# Patient Record
Sex: Male | Born: 1965 | Race: Black or African American | Hispanic: No
Health system: Southern US, Community
[De-identification: ages and names within clinical notes are randomized; demographics above are authoritative.]

## PROBLEM LIST (undated history)

## (undated) DIAGNOSIS — T7840XA Allergy, unspecified, initial encounter: Secondary | ICD-10-CM

## (undated) DIAGNOSIS — Z87442 Personal history of urinary calculi: Secondary | ICD-10-CM

## (undated) DIAGNOSIS — I10 Essential (primary) hypertension: Secondary | ICD-10-CM

## (undated) HISTORY — DX: Allergy, unspecified, initial encounter: T78.40XA

## (undated) HISTORY — PX: NO PAST SURGERIES: SHX2092

---

## 2004-05-26 ENCOUNTER — Emergency Department: Payer: Self-pay | Admitting: Unknown Physician Specialty

## 2015-11-17 ENCOUNTER — Ambulatory Visit: Payer: Self-pay | Admitting: Family Medicine

## 2015-12-09 ENCOUNTER — Ambulatory Visit (INDEPENDENT_AMBULATORY_CARE_PROVIDER_SITE_OTHER): Payer: Managed Care, Other (non HMO) | Admitting: Family Medicine

## 2015-12-09 ENCOUNTER — Encounter: Payer: Self-pay | Admitting: Family Medicine

## 2015-12-09 VITALS — BP 158/90 | HR 79 | Temp 97.7°F | Resp 16 | Ht 68.0 in | Wt 187.0 lb

## 2015-12-09 DIAGNOSIS — R208 Other disturbances of skin sensation: Secondary | ICD-10-CM

## 2015-12-09 DIAGNOSIS — Z7189 Other specified counseling: Secondary | ICD-10-CM | POA: Diagnosis not present

## 2015-12-09 DIAGNOSIS — Z7689 Persons encountering health services in other specified circumstances: Secondary | ICD-10-CM

## 2015-12-09 DIAGNOSIS — Z125 Encounter for screening for malignant neoplasm of prostate: Secondary | ICD-10-CM | POA: Diagnosis not present

## 2015-12-09 DIAGNOSIS — R030 Elevated blood-pressure reading, without diagnosis of hypertension: Secondary | ICD-10-CM

## 2015-12-09 DIAGNOSIS — R200 Anesthesia of skin: Secondary | ICD-10-CM | POA: Insufficient documentation

## 2015-12-09 DIAGNOSIS — I10 Essential (primary) hypertension: Secondary | ICD-10-CM | POA: Insufficient documentation

## 2015-12-09 DIAGNOSIS — IMO0001 Reserved for inherently not codable concepts without codable children: Secondary | ICD-10-CM

## 2015-12-09 NOTE — Patient Instructions (Signed)
Your goal blood pressure is 140/90- please check 2-3 times per week and write it down. Work on low salt/sodium diet - goal <1.5gm (1,500mg ) per day. Eat a diet high in fruits/vegetables and whole grains.  Look into mediterranean and DASH diet. Goal activity is 17750min/wk of moderate intensity exercise.  This can be split into 30 minute chunks.  If you are not at this level, you can start with smaller 10-15 min increments and slowly build up activity. Look at www.heart.org for more resources  Please seek immediate medical attention at ER or Urgent Care if you develop: Chest pain, pressure or tightness. Shortness of breath accompanied by nausea or diaphoresis Visual changes Numbness or tingling on one side of the body Facial droop Altered mental status Or any concerning symptoms.

## 2015-12-09 NOTE — Assessment & Plan Note (Addendum)
Elevated BP- will check at home 2-3 times weekly. Recheck in 1 mos. Reviewed DASH diet. Reviewed alarm symptoms. Recheck 1 mos.

## 2015-12-09 NOTE — Assessment & Plan Note (Addendum)
ECG-machine read showed possible old infarct but no acute changes. Do not believe arm numbness is cardiac related. ECG looks WNL. Likely nerve impingement possible due to shoulder. Will continue to monitor.

## 2015-12-09 NOTE — Progress Notes (Signed)
Subjective:    Patient ID: Ronald Sweeney, male    DOB: Jun 27, 1966, 50 y.o.   MRN: [id number]  HPI: Ronald Sweeney is a 50 y.o. male presenting on 12/09/2015 for Establish Care and Hypertension   HPI  Pt presents to establish care today. Previous care provider was .  It has been several years years since His last PCP visit. Records from previous provider will be requested and reviewed. Current medical problems include:  Pt presents for hypertension- found at dentist. Does get anxious at doctor and dentist. No SOB. No CP. No HA. Does get occasional floaters when changing position. Has been trying to cut down salt and fried food.   Does report a few episodes of L arm numbness. 2-3 times in past year. No chest pressure of pain during arm numbness. From bicep to mid-forearm. Primarily upper arm not in the shoulder.  Has taken and advil and it will go away. Happened last 3 mos ago. Lasts about 1-2 hours. No neck injury. Lifts- has some repetitve motions. Does notice some crepitus in the L shoulder with up down movements.   Health maintenance:  Does some lifting at the gym. Trying to not do lifting with shoulders.  Would like to screen PSA. Does have family history of colon cancer.  Never had a colonoscopy.     Past Medical History  Diagnosis Date  . Allergy    Social History   Social History  . Marital Status: Married    Spouse Name: N/A  . Number of Children: N/A  . Years of Education: N/A   Occupational History  . Not on file.   Social History Main Topics  . Smoking status: Never Smoker   . Smokeless tobacco: Never Used  . Alcohol Use: No  . Drug Use: No  . Sexual Activity: Yes   Other Topics Concern  . Not on file   Social History Narrative  . No narrative on file   Family History  Problem Relation Age of Onset  . Cancer Mother     colon cancer  . Colon polyps Mother    No current outpatient prescriptions on file prior to visit.   No current  facility-administered medications on file prior to visit.    Review of Systems  Constitutional: Negative for fever and chills.  HENT: Negative.   Respiratory: Negative for chest tightness, shortness of breath and wheezing.   Cardiovascular: Negative for chest pain, palpitations and leg swelling.  Gastrointestinal: Negative for nausea, vomiting and abdominal pain.  Endocrine: Negative.   Genitourinary: Negative for dysuria, urgency, discharge, penile pain and testicular pain.  Musculoskeletal: Negative for back pain, joint swelling and arthralgias.  Skin: Negative.   Neurological: Positive for numbness (L arm from bicep to forearm- intermittently.  ). Negative for dizziness, weakness and headaches.  Psychiatric/Behavioral: Negative for sleep disturbance and dysphoric mood.   Per HPI unless specifically indicated above     Objective:    BP 158/90 mmHg  Pulse 79  Temp(Src) 97.7 F (36.5 C) (Oral)  Resp 16  Ht 5\' 8"  (1.727 m)  Wt 187 lb (84.823 kg)  BMI 28.44 kg/m2  Wt Readings from Last 3 Encounters:  12/09/15 187 lb (84.823 kg)    Physical Exam  Constitutional: He is oriented to person, place, and time. He appears well-developed and well-nourished. No distress.  HENT:  Head: Normocephalic and atraumatic.  Neck: Neck supple. No thyromegaly present.  Cardiovascular: Normal rate, regular rhythm and normal heart sounds.  Exam reveals no gallop and no friction rub.   No murmur heard. Pulmonary/Chest: Effort normal and breath sounds normal. He has no wheezes.  Abdominal: Soft. Bowel sounds are normal. He exhibits no distension. There is no tenderness. There is no rebound.  Musculoskeletal: Normal range of motion. He exhibits no edema or tenderness.       Left shoulder: He exhibits crepitus (some with movement. ). He exhibits normal range of motion, no tenderness, no pain and normal strength.  Neurological: He is alert and oriented to person, place, and time. He has normal reflexes.   Skin: Skin is warm and dry. No rash noted. No erythema.  Psychiatric: He has a normal mood and affect. His behavior is normal. Thought content normal.   No results found for this or any previous visit.    Assessment & Plan:   Problem List Items Addressed This Visit      Other   Left arm numbness    ECG-machine read showed possible old infarct but no acute changes. Do not believe arm numbness is cardiac related. ECG looks WNL. Likely nerve impingement possible due to shoulder. Will continue to monitor.       Relevant Orders   EKG 12-Lead   Elevated BP    Elevated BP- will check at home 2-3 times weekly. Recheck in 1 mos. Reviewed DASH diet. Reviewed alarm symptoms. Recheck 1 mos.       Relevant Orders   COMPLETE METABOLIC PANEL WITH GFR   Lipid Profile    Other Visit Diagnoses    Encounter to establish care    -  Primary    Screening for prostate cancer        Relevant Orders    PSA       No orders of the defined types were placed in this encounter.      Follow up plan: Return in about 4 weeks (around 01/06/2016), or if symptoms worsen or fail to improve, for BP check. Marland Kitchen

## 2016-01-10 ENCOUNTER — Other Ambulatory Visit: Payer: Self-pay | Admitting: Family Medicine

## 2016-01-10 ENCOUNTER — Other Ambulatory Visit: Payer: Managed Care, Other (non HMO)

## 2016-01-11 LAB — COMPLETE METABOLIC PANEL WITH GFR
ALBUMIN: 4.2 g/dL (ref 3.6–5.1)
ALK PHOS: 46 U/L (ref 40–115)
ALT: 19 U/L (ref 9–46)
AST: 17 U/L (ref 10–40)
BILIRUBIN TOTAL: 0.5 mg/dL (ref 0.2–1.2)
BUN: 13 mg/dL (ref 7–25)
CALCIUM: 9.4 mg/dL (ref 8.6–10.3)
CO2: 26 mmol/L (ref 20–31)
Chloride: 103 mmol/L (ref 98–110)
Creat: 1.13 mg/dL (ref 0.60–1.35)
GFR, EST AFRICAN AMERICAN: 88 mL/min (ref >=60)
GFR, EST NON AFRICAN AMERICAN: 76 mL/min (ref >=60)
Glucose, Bld: 104 mg/dL — ABNORMAL HIGH (ref 65–99)
Potassium: 4.1 mmol/L (ref 3.5–5.3)
Sodium: 141 mmol/L (ref 135–146)
TOTAL PROTEIN: 6.9 g/dL (ref 6.1–8.1)

## 2016-01-11 LAB — LIPID PANEL
CHOLESTEROL: 195 mg/dL (ref 125–200)
HDL: 68 mg/dL (ref >=40)
LDL Cholesterol: 99 mg/dL (ref <130)
TRIGLYCERIDES: 139 mg/dL (ref <150)
Total CHOL/HDL Ratio: 2.9 Ratio (ref <=5.0)
VLDL: 28 mg/dL (ref <30)

## 2016-01-11 LAB — PSA: PSA: 1.49 ng/mL (ref <=4.00)

## 2016-01-12 ENCOUNTER — Encounter: Payer: Self-pay | Admitting: Family Medicine

## 2016-01-12 ENCOUNTER — Ambulatory Visit (INDEPENDENT_AMBULATORY_CARE_PROVIDER_SITE_OTHER): Payer: Managed Care, Other (non HMO) | Admitting: Family Medicine

## 2016-01-12 VITALS — BP 138/92 | HR 95 | Temp 97.7°F | Resp 16 | Ht 68.0 in | Wt 188.0 lb

## 2016-01-12 DIAGNOSIS — R030 Elevated blood-pressure reading, without diagnosis of hypertension: Secondary | ICD-10-CM | POA: Diagnosis not present

## 2016-01-12 DIAGNOSIS — M25512 Pain in left shoulder: Secondary | ICD-10-CM | POA: Diagnosis not present

## 2016-01-12 DIAGNOSIS — R7301 Impaired fasting glucose: Secondary | ICD-10-CM | POA: Diagnosis not present

## 2016-01-12 DIAGNOSIS — IMO0001 Reserved for inherently not codable concepts without codable children: Secondary | ICD-10-CM

## 2016-01-12 LAB — POCT GLYCOSYLATED HEMOGLOBIN (HGB A1C): Hemoglobin A1C: 5.9

## 2016-01-12 NOTE — Progress Notes (Signed)
Subjective:    Patient ID: Ronald Sweeney, male    DOB: 1966-06-30, 50 y.o.   MRN: [id number]  HPI: Ronald Sweeney is a 50 y.o. male presenting on 01/12/2016 for Hypertension   HPI  Pt presents for follow-up of elevated BP. Cholesterol is elevated. ASCVD risk is 6.5%. He would like to avoid cholesterol medication at this time.  BP is elevated but he worked out about 1 hour ago. Was not checking at home.  Would like to lose some weight- no consistent exercise schedule. Did workout this morning just prior to appt.  Glucose is was elevated- fasting. Drinks soda, sweet tea. Has tried to cut out soda. Drinks ginger ale or juice a few times per week.  L shoulder pain when lifting. Hears clicking and popping. Pain is not constant. Only occurs with certain movements.   Past Medical History  Diagnosis Date  . Allergy     No current outpatient prescriptions on file prior to visit.   No current facility-administered medications on file prior to visit.    Review of Systems  Constitutional: Negative for fever and chills.  HENT: Negative.   Respiratory: Negative for chest tightness, shortness of breath and wheezing.   Cardiovascular: Negative for chest pain, palpitations and leg swelling.  Gastrointestinal: Negative for nausea, vomiting and abdominal pain.  Endocrine: Negative.   Genitourinary: Negative for dysuria, urgency, discharge, penile pain and testicular pain.  Musculoskeletal: Positive for arthralgias. Negative for back pain and joint swelling.  Skin: Negative.   Neurological: Negative for dizziness, weakness, numbness and headaches.  Psychiatric/Behavioral: Negative for sleep disturbance and dysphoric mood.   Per HPI unless specifically indicated above     Objective:    BP 138/92 mmHg  Pulse 95  Temp(Src) 97.7 F (36.5 C)  Resp 16  Ht  (1.727 m)  Wt 188 lb (85.276 kg)  BMI 28.59 kg/m2  SpO2 98%  Wt Readings from Last 3 Encounters:  01/12/16 188 lb (85.276  kg)  12/09/15 187 lb (84.823 kg)    Physical Exam  Constitutional: He is oriented to person, place, and time. He appears well-developed and well-nourished. No distress.  HENT:  Head: Normocephalic and atraumatic.  Neck: Neck supple. No thyromegaly present.  Cardiovascular: Normal rate, regular rhythm and normal heart sounds.  Exam reveals no gallop and no friction rub.   No murmur heard. Pulmonary/Chest: Effort normal and breath sounds normal. He has no wheezes.  Abdominal: Soft. Bowel sounds are normal. He exhibits no distension. There is no tenderness. There is no rebound.  Musculoskeletal: Normal range of motion. He exhibits no edema or tenderness.       Right shoulder: Normal.       Left shoulder: He exhibits normal range of motion, no tenderness, no bony tenderness, no swelling, no crepitus and no pain.  +painful arc test on L shoulder.   Neurological: He is alert and oriented to person, place, and time. He has normal reflexes.  Skin: Skin is warm and dry. No rash noted. No erythema.  Psychiatric: He has a normal mood and affect. His behavior is normal. Thought content normal.   Results for orders placed or performed in visit on 01/12/16  POCT HgB A1C  Result Value Ref Range   Hemoglobin A1C 5.9       Assessment & Plan:   Problem List Items Addressed This Visit      Endocrine   Elevated fasting glucose - Primary    A1c is 5.9%  is prediabetic. Reviewed goals for weight loss and exercise. Pt plans to eliminate soda and juice. Plan to recheck in 3 mos.       Relevant Orders   POCT HgB A1C (Completed)     Other   Elevated BP    BP significantly reduced on recheck. However with vigorous exercise within 1 hour of appt- cannot officially diagnose hypertension. Will plan on having patient return for BP recheck at RN visit. Reviewed DASH diet.        Other Visit Diagnoses    Left shoulder pain        Arthritis vs rotator cuff injury. XR of shoulder. PRN Aleve. Plan on ortho  referral for continued pain.     Relevant Orders    DG Shoulder Left       No orders of the defined types were placed in this encounter.      Follow up plan: Return in about 3 months (around 04/13/2016) for Prediabetes. .Marland Kitchen

## 2016-01-12 NOTE — Assessment & Plan Note (Signed)
A1c is 5.9% is prediabetic. Reviewed goals for weight loss and exercise. Pt plans to eliminate soda and juice. Plan to recheck in 3 mos.

## 2016-01-12 NOTE — Assessment & Plan Note (Signed)
BP significantly reduced on recheck. However with vigorous exercise within 1 hour of appt- cannot officially diagnose hypertension. Will plan on having patient return for BP recheck at RN visit. Reviewed DASH diet.

## 2016-01-12 NOTE — Patient Instructions (Addendum)
Our goal is to prevent diabetes with treatment of this through diet and lifestyle changes.  Eliminate all sugar sweetened beverages from diet. Decrease portion sizes of breads, rice, pasta. Increase exercise to 30-40 minutes 3-4 times per week. The CDC and American Diabetes Association have great resources on their websites.  We will recheck your A1c in 3 mos.   Your goal blood pressure is 140/90. Work on low salt/sodium diet - goal <1.5gm (1,500mg ) per day. Eat a diet high in fruits/vegetables and whole grains.  Look into mediterranean and DASH diet. Goal activity is 15150min/wk of moderate intensity exercise.  This can be split into 30 minute chunks.  If you are not at this level, you can start with smaller 10-15 min increments and slowly build up activity. Look at www.heart.org for more resources  Please come back in a few weeks to have your blood pressure checked. Please come get your shoulder XR when you come back for blood pressure.

## 2016-02-21 ENCOUNTER — Ambulatory Visit (INDEPENDENT_AMBULATORY_CARE_PROVIDER_SITE_OTHER): Payer: Managed Care, Other (non HMO) | Admitting: Family Medicine

## 2016-02-21 VITALS — BP 144/98 | HR 81 | Temp 98.4°F | Resp 16 | Ht 68.0 in | Wt 193.0 lb

## 2016-02-21 DIAGNOSIS — IMO0001 Reserved for inherently not codable concepts without codable children: Secondary | ICD-10-CM

## 2016-02-21 DIAGNOSIS — L237 Allergic contact dermatitis due to plants, except food: Secondary | ICD-10-CM | POA: Diagnosis not present

## 2016-02-21 DIAGNOSIS — R030 Elevated blood-pressure reading, without diagnosis of hypertension: Secondary | ICD-10-CM | POA: Diagnosis not present

## 2016-02-21 MED ORDER — TRIAMCINOLONE ACETONIDE 0.1 % EX CREA: 1.0000 "application " | TOPICAL_CREAM | Freq: Two times a day (BID) | CUTANEOUS | 0 refills | Status: DC

## 2016-02-21 MED ORDER — PREDNISONE 10 MG PO TABS: ORAL_TABLET | ORAL | 0 refills | Status: DC

## 2016-02-21 NOTE — Progress Notes (Signed)
Subjective:    Patient ID: Ronald Sweeney, male    DOB: [birth year], 50 y.o.   MRN: [id number]  HPI: Ronald OctaveDexter T Bostwick is a 50 y.o. male [facility]   HPI  Pt presents for poision ivy or poison oak. Mowed on Friday and had lesions by Sunday morning. Lesions on L arm. Has been doing benadryl cream at home.   Past Medical History:  Diagnosis Date  . Allergy     No current outpatient prescriptions on file prior to visit.   No current facility-administered medications on file prior to visit.     Review of Systems  Constitutional: Negative for fever.  Respiratory: Negative for apnea, shortness of breath and wheezing.   Cardiovascular: Negative for chest pain, palpitations and leg swelling.  Skin: Positive for rash.   Per HPI unless specifically indicated above     Objective:    BP (!) 144/98 (BP Location: Left Arm, Cuff Size: Large)   Pulse 81   Temp 98.4 F (36.9 C) (Oral)   Resp 16   Ht 5\' 8"  (1.727 m)   Wt 193 lb (87.5 kg)   BMI 29.35 kg/m   Wt Readings from Last 3 Encounters:  02/21/16 193 lb (87.5 kg)  01/12/16 188 lb (85.3 kg)  12/09/15 187 lb (84.8 kg)    Physical Exam  Constitutional: He appears well-developed and well-nourished. No distress.  Neck: Normal range of motion. Neck supple.  Cardiovascular: Normal rate and regular rhythm.  Exam reveals no gallop and no friction rub.   No murmur heard. Pulmonary/Chest: Effort normal and breath sounds normal. He has no wheezes. He exhibits no tenderness.  Skin: Skin is warm, dry and intact. Rash noted. Rash is macular, papular and vesicular. He is not diaphoretic.  Scattered macules, papules with some vesicular lesions on the R forearm.    Results for orders placed or performed in visit on 01/12/16  POCT HgB A1C  Result Value Ref Range   Hemoglobin A1C 5.9       Assessment & Plan:   Problem List Items Addressed This Visit      Other   Elevated BP    Discussed  starting medication with patient today. Pt would like to wait 1 mos to apply lifestyle changes. Pt will check BP at home. Alarm symptoms reviewed. Encouraged DASH diet. Recheck 4 weeks.        Other Visit Diagnoses    Poison oak dermatitis    -  Primary   Treat symptomatically with triamcinolone and steroid taper. Alarm symptoms reviewed. Return if not improving.    Relevant Medications   triamcinolone cream (KENALOG) 0.1 %   predniSONE (DELTASONE) 10 MG tablet      Meds ordered this encounter  Medications  . triamcinolone cream (KENALOG) 0.1 %    Sig: Apply 1 application topically 2 (two) times daily.    Dispense:  30 g    Refill:  0    Order Specific Question:   Supervising Provider    Answer:   Janeann ForehandHAWKINS JR, JAMES H [[id number]]  . predniSONE (DELTASONE) 10 MG tablet    Sig: Take 6 pills today, 5 pills day 2, 4 pills day 3, 3 pills day 4, 2 pills day 5, 1 pill day 6. STOP.    Dispense:  21 tablet    Refill:  0    Order Specific Question:   Supervising Provider    Answer:   Juanetta GoslingHAWKINS  JR, JAMTeresita MaduraS H [[id number]]      Follow up plan: Return if symptoms worsen or fail to improve.

## 2016-02-21 NOTE — Patient Instructions (Addendum)
I think we should start blood pressure medication today. However we can try 1 more month of lifestyle modifications.  Your goal blood pressure is 140/90 Work on low salt/sodium diet - goal <1.5gm (1,500mg ) per day. Eat a diet high in fruits/vegetables and whole grains.  Look into mediterranean and DASH diet. Goal activity is 14750min/wk of moderate intensity exercise.  This can be split into 30 minute chunks.  If you are not at this level, you can start with smaller 10-15 min increments and slowly build up activity. Look at www.heart.org for more resources   [facility] Poison ivy is a inflammation of the skin (contact dermatitis) caused by touching the allergens on the leaves of the ivy plant following previous exposure to the plant. The rash usually appears 48 hours after exposure. The rash is usually bumps (papules) or blisters (vesicles) in a linear pattern. Depending on your own sensitivity, the rash may simply cause redness and itching, or it may also progress to blisters which may break open. These must be well cared for to prevent secondary bacterial (germ) infection, followed by scarring. Keep any open areas dry, clean, dressed, and covered with an antibacterial ointment if needed. The eyes may also get puffy. The puffiness is worst in the morning and gets better as the day progresses. This dermatitis usually heals without scarring, within 2 to 3 weeks without treatment. HOME CARE INSTRUCTIONS  Thoroughly wash with soap and water as soon as you have been exposed to poison ivy. You have about one half hour to remove the plant resin before it will cause the rash. This washing will destroy the oil or antigen on the skin that is causing, or will cause, the rash. Be sure to wash under your fingernails as any plant resin there will continue to spread the rash. Do not rub skin vigorously when washing affected area. Poison ivy cannot spread if no oil from the plant remains on your body. A rash that has  progressed to weeping sores will not spread the rash unless you have not washed thoroughly. It is also important to wash any clothes you have been wearing as these may carry active allergens. The rash will return if you wear the unwashed clothing, even several days later. Avoidance of the plant in the future is the best measure. Poison ivy plant can be recognized by the number of leaves. Generally, poison ivy has three leaves with flowering branches on a single stem. Diphenhydramine may be purchased over the counter and used as needed for itching. Do not drive with this medication if it makes you drowsy.Ask your caregiver about medication for children. SEEK MEDICAL CARE IF:  Open sores develop.  Redness spreads beyond area of rash.  You notice purulent (pus-like) discharge.  You have increased pain.  Other signs of infection develop (such as fever).   This information is not intended to replace advice given to you by your health care provider. Make sure you discuss any questions you have with your health care provider.   Document Released: 06/15/2000 Document Revised: 09/10/2011 Document Reviewed: 11/24/2014 Elsevier Interactive Patient Education [employer]2016 [employer].

## 2016-02-21 NOTE — Assessment & Plan Note (Signed)
Discussed starting medication with patient today. Pt would like to wait 1 mos to apply lifestyle changes. Pt will check BP at home. Alarm symptoms reviewed. Encouraged DASH diet. Recheck 4 weeks.

## 2016-10-12 ENCOUNTER — Encounter: Payer: Self-pay | Admitting: Nurse Practitioner

## 2016-10-12 ENCOUNTER — Ambulatory Visit (INDEPENDENT_AMBULATORY_CARE_PROVIDER_SITE_OTHER): Payer: Managed Care, Other (non HMO) | Admitting: Nurse Practitioner

## 2016-10-12 VITALS — BP 140/90 | HR 82 | Temp 98.2°F | Resp 16 | Ht 68.0 in | Wt 191.0 lb

## 2016-10-12 DIAGNOSIS — Z6829 Body mass index (BMI) 29.0-29.9, adult: Secondary | ICD-10-CM | POA: Diagnosis not present

## 2016-10-12 DIAGNOSIS — Z0000 Encounter for general adult medical examination without abnormal findings: Secondary | ICD-10-CM | POA: Diagnosis not present

## 2016-10-12 DIAGNOSIS — I10 Essential (primary) hypertension: Secondary | ICD-10-CM

## 2016-10-12 LAB — COMPLETE METABOLIC PANEL WITH GFR
ALT: 20 U/L (ref 9–46)
AST: 20 U/L (ref 10–40)
Albumin: 4.4 g/dL (ref 3.6–5.1)
Alkaline Phosphatase: 42 U/L (ref 40–115)
BUN: 15 mg/dL (ref 7–25)
CO2: 23 mmol/L (ref 20–31)
Calcium: 9.6 mg/dL (ref 8.6–10.3)
Chloride: 103 mmol/L (ref 98–110)
Creat: 1.21 mg/dL (ref 0.60–1.35)
GFR, Est African American: 81 mL/min (ref >=60)
GFR, Est Non African American: 70 mL/min (ref >=60)
Glucose, Bld: 103 mg/dL — ABNORMAL HIGH (ref 65–99)
Potassium: 4 mmol/L (ref 3.5–5.3)
Sodium: 140 mmol/L (ref 135–146)
Total Bilirubin: 0.7 mg/dL (ref 0.2–1.2)
Total Protein: 7.4 g/dL (ref 6.1–8.1)

## 2016-10-12 LAB — LIPID PANEL
Cholesterol: 185 mg/dL (ref <200)
HDL: 71 mg/dL (ref >40)
LDL Cholesterol: 95 mg/dL (ref <100)
Total CHOL/HDL Ratio: 2.6 Ratio (ref <5.0)
Triglycerides: 97 mg/dL (ref <150)
VLDL: 19 mg/dL (ref <30)

## 2016-10-12 LAB — CBC
HCT: 45.4 % (ref 38.5–50.0)
Hemoglobin: 15.1 g/dL (ref 13.2–17.1)
MCH: 27.7 pg (ref 27.0–33.0)
MCHC: 33.3 g/dL (ref 32.0–36.0)
MCV: 83.2 fL (ref 80.0–100.0)
MPV: 10 fL (ref 7.5–12.5)
Platelets: 166 10*3/uL (ref 140–400)
RBC: 5.46 MIL/uL (ref 4.20–5.80)
RDW: 13.4 % (ref 11.0–15.0)
WBC: 4.6 10*3/uL (ref 3.8–10.8)

## 2016-10-12 MED ORDER — HYDROCHLOROTHIAZIDE 12.5 MG PO TABS: 12.5000 mg | ORAL_TABLET | Freq: Every day | ORAL | 2 refills | Status: DC

## 2016-10-12 NOTE — Addendum Note (Signed)
Addended by: Wilhelmina Mcardle R on: 10/12/2016 01:57 PM   Modules accepted: Level of Service

## 2016-10-12 NOTE — Progress Notes (Signed)
I have reviewed this encounter including the documentation in this note and/or discussed this patient with the provider, Wilhelmina Mcardle, AGPCNP-BC. I am certifying that I agree with the content of this note as supervising physician.  Saralyn Pilar, DO [facility] 10/12/2016, 6:14 PM

## 2016-10-12 NOTE — Progress Notes (Signed)
Subjective:    Patient ID: Ronald Sweeney, male    DOB: Apr 07, 1966, 51 y.o.   MRN: [id number]  Ronald Sweeney is a 51 y.o. male presenting on 10/12/2016 for Annual Exam   HPI   Presents today for his annual physical.  Notes some residual congestion from an URI and thinks there still allergies contributing, but is trying some Zyrtec to see if it helps.  He states he will return if needed for these symptoms.  Elevated BP reading at last physical and 1-2 subsequent visits.  Plan at that time was lifestyle management and patient was successful for about 3 months and lost 7 lbs.  He was unable to sustain long-term. He had made good eating a priority, was using myfitnesspal for calorie tracking(Goal 2,000 calories per day), and choosing a low salt diet. He still avoids fast food, fried foods.  His wife only bakes foods at home and never fries food.  He does have a [employer] membership and currently exercises 1x in 3 weeks.  When he does exercise, he plays basketball, lift weights.  Biggest challenge to regular exercise is an irregular schedule with work hours in small blocks between 1-2nd shifts and sometimes 3rd.  Health Maintenance: 1. He requests advance planning for a Colonoscopy after his birthdate on [birth year].  He has a family history (Mother) who had colon cancer. 2. He also asks about prostate cancer and screening, specifically when to start and how to monitor.   3. Tetanus within last 10 years, unverified. 4. Declines flu vaccine each year. 5. Endorses seatbelt use all the time while in the car. 6. He does not regularly use sunscreen, but does wear hats and long sleeve shirts to limit sun exposure. 7. Pt in a monogamous relationship for over 20 years.  Declines HIV screening today. He believes he has had one in past.   Past Medical History:  Diagnosis Date  . Allergy    seasonal   No past surgical history on file. Social History   Social History  . Marital status: Married   Spouse name: N/A  . Number of children: N/A  . Years of education: N/A   Occupational History  . Not on file.   Social History Main Topics  . Smoking status: Never Smoker  . Smokeless tobacco: Never Used  . Alcohol use No  . Drug use: No  . Sexual activity: Yes    Birth control/ protection: None   Other Topics Concern  . Not on file   Social History Narrative  . No narrative on file   Family History  Problem Relation Age of Onset  . Cancer Mother     colon cancer  . Colon polyps Mother    No current outpatient prescriptions on file prior to visit.   No current facility-administered medications on file prior to visit.     Review of Systems  Constitutional: Negative.   HENT: Positive for congestion.   Eyes: Negative.   Respiratory: Negative.   Cardiovascular: Negative.        Some tingling in left arm (positional). Pt denies headache, lightheadedness, dizziness, changes in vision, chest tightness/pressure, palpitations, sudden loss of speech or loss of consiousness.   Gastrointestinal: Negative.   Endocrine: Negative.   Genitourinary: Positive for urgency. Negative for decreased urine volume, difficulty urinating, discharge, dysuria, enuresis, frequency, genital sores, hematuria, penile pain, penile swelling, scrotal swelling and testicular pain.       Occasional hesitancy  Musculoskeletal: Negative.  Skin: Negative.   Allergic/Immunologic: Negative.   Neurological: Negative.   Hematological: Negative.   Psychiatric/Behavioral: Negative.    Per HPI unless specifically indicated above     Objective:    BP (!) 159/99 (BP Location: Right Arm, Patient Position: Sitting, Cuff Size: Large)   Pulse 82   Temp 98.2 F (36.8 C) (Oral)   Resp 16   Ht  (1.727 m)   Wt 191 lb (86.6 kg)   BMI 29.04 kg/m    BP recheck: 140/95  Wt Readings from Last 3 Encounters:  10/12/16 191 lb (86.6 kg)  02/21/16 193 lb (87.5 kg)  01/12/16 188 lb (85.3 kg)    Physical  Exam  Constitutional: He is oriented to person, place, and time. He appears well-developed and well-nourished. He appears distressed.  HENT:  Head: Normocephalic and atraumatic.  Right Ear: Hearing, tympanic membrane, external ear and ear canal normal.  Left Ear: Hearing, tympanic membrane, external ear and ear canal normal.  Nose: Nose normal.  Mouth/Throat: Mucous membranes are normal. No posterior oropharyngeal edema or posterior oropharyngeal erythema.  cobblestoning  Eyes: Conjunctivae and EOM are normal. Pupils are equal, round, and reactive to light.  Neck: Normal range of motion. Neck supple. No JVD present. No tracheal deviation present. No thyromegaly present.  Cardiovascular: Normal rate, regular rhythm, normal heart sounds and intact distal pulses.   Pulmonary/Chest: Effort normal and breath sounds normal.  Abdominal: Soft. Bowel sounds are normal. He exhibits no distension. There is no tenderness.  No hepatosplenomegaly  Genitourinary: Rectum normal, prostate normal and penis normal.  Musculoskeletal: Normal range of motion.  Lymphadenopathy:    He has no cervical adenopathy.  Neurological: He is alert and oriented to person, place, and time. He has normal reflexes.  Skin: Skin is warm and dry. No rash noted. No erythema.  Psychiatric: He has a normal mood and affect. His behavior is normal. Thought content normal.  Vitals reviewed.       Assessment & Plan:   Problem List Items Addressed This Visit    None    Visit Diagnoses    Annual physical exam    -  Primary Patient is well informed about overall health and desires to proceed with screening for prevention of chronic diseases.   Plan: 1. Screen lipid panel. 2. Screen CBC, CMP, TSH, HgB A1c. 3. Screening requested by patient and referral placed to GI for screening colonoscopy after birthday this year (on or after 10/30/2016). 4. Prostate screening pros and cons discussed.  Patient agrees to DRE for prostate check.   Defer PSA until patient is 52 years old.  Agree to trend values, but patient does want PSA checked in the future.    Relevant Orders   Lipid panel   COMPLETE METABOLIC PANEL WITH GFR   CBC   TSH   HgB A1c   Ambulatory referral to Gastroenterology   Essential hypertension     Worsening BP control.  Lifestyle modification effective for 2-3 months last year, but not sustained.  1. Discussed lifestyle modification to include regular physical activity, health eating patterns, weight loss with pt stated Goal: 175 lbs.   2. Discussed BP goal of less than 130/90. 3. Add Rx monotherapy: Thiazide in african american - START HCTZ 12.5 mg take one tablet once daily.   Relevant Medications   aspirin 81 MG tablet   hydrochlorothiazide (HYDRODIURIL) 12.5 MG tablet      Meds ordered this encounter  Medications  . aspirin 81  MG tablet    Sig: Take 81 mg by mouth daily.  . hydrochlorothiazide (HYDRODIURIL) 12.5 MG tablet    Sig: Take 1 tablet (12.5 mg total) by mouth daily.    Dispense:  30 tablet    Refill:  2    Order Specific Question:   Supervising Provider    Answer:   Smitty Cords [[id number]]     Follow up plan: Return in about 3 months (around 01/11/2017) for BP.  Wilhelmina Mcardle, DNP, AGPCNP-BC Adult Gerontology Primary Care Nurse Practitioner [facility] 10/12/2016, 10:32 AM

## 2016-10-12 NOTE — Patient Instructions (Signed)
Ronald Sweeney, Thank you for coming in to clinic today.  1. Your physical exam is normal.  We will do some screening labs to check for cholesterol, kidney and liver function, pre-diabetes, and thyroid diseases.  2. For your blood pressure - You do have hypertension. -  We are going to start hydrochlorothiazide 12.5 mg once per day. - Goal: less than 130/80  Please schedule a follow-up appointment with Ronald Sweeney, AGNP in 3 months.  If you have any other questions or concerns, please feel free to call the clinic or send a message through MyChart. You may also schedule an earlier appointment if necessary.  Ronald Mcardle, DNP, AGNP-BC Adult Gerontology Nurse Practitioner [facility]    DASH Eating Plan DASH stands for "Dietary Approaches to Stop Hypertension." The DASH eating plan is a healthy eating plan that has been shown to reduce high blood pressure (hypertension). It may also reduce your risk for type 2 diabetes, heart disease, and stroke. The DASH eating plan may also help with weight loss. What are tips for following this plan? General guidelines   Avoid eating more than 2,300 mg (milligrams) of salt (sodium) a day or 1 teaspoon. If you have hypertension, you may need to reduce your sodium intake to 1,500 mg a day.  Limit alcohol intake to no more than 1 drink a day for nonpregnant women and 2 drinks a day for men. One drink equals 12 oz of beer, 5 oz of wine, or 1 oz of hard liquor.  Work with your health care provider to maintain a healthy body weight or to lose weight. Ask what an ideal weight is for you.  Get at least 30 minutes of exercise that causes your heart to beat faster (aerobic exercise) most days of the week. Activities may include walking, swimming, or biking.  Work with your health care provider or diet and nutrition specialist (dietitian) to adjust your eating plan to your individual calorie needs. Reading food labels   Check food labels  for the amount of sodium per serving. Choose foods with less than 5 percent of the Daily Value of sodium. Generally, foods with less than 300 mg of sodium per serving fit into this eating plan.  To find whole grains, look for the word "whole" as the first word in the ingredient list. Shopping   Buy products labeled as "low-sodium" or "no salt added."  Buy fresh foods. Avoid canned foods and premade or frozen meals. Cooking   Avoid adding salt when cooking. Use salt-free seasonings or herbs instead of table salt or sea salt. Check with your health care provider or pharmacist before using salt substitutes.  Do not fry foods. Cook foods using healthy methods such as baking, boiling, grilling, and broiling instead.  Cook with heart-healthy oils, such as olive, canola, soybean, or sunflower oil. Meal planning    Eat a balanced diet that includes:  5 or more servings of fruits and vegetables each day. At each meal, try to fill half of your plate with fruits and vegetables.  Up to 6-8 servings of whole grains each day.  Less than 6 oz of lean meat, poultry, or fish each day. A 3-oz serving of meat is about the same size as a deck of cards. One egg equals 1 oz.  2 servings of low-fat dairy each day.  A serving of nuts, seeds, or beans 5 times each week.  Heart-healthy fats. Healthy fats called Omega-3 fatty acids are found in foods  such as flaxseeds and coldwater fish, like sardines, salmon, and mackerel.  Limit how much you eat of the following:  Canned or prepackaged foods.  Food that is high in trans fat, such as fried foods.  Food that is high in saturated fat, such as fatty meat.  Sweets, desserts, sugary drinks, and other foods with added sugar.  Full-fat dairy products.  Do not salt foods before eating.  Try to eat at least 2 vegetarian meals each week.  Eat more home-cooked food and less restaurant, buffet, and fast food.  When eating at a restaurant, ask that your  food be prepared with less salt or no salt, if possible. What foods are recommended? The items listed may not be a complete list. Talk with your dietitian about what dietary choices are best for you. Grains  Whole-grain or whole-wheat bread. Whole-grain or whole-wheat pasta. Brown rice. Orpah Cobb. Bulgur. Whole-grain and low-sodium cereals. Pita bread. Low-fat, low-sodium crackers. Whole-wheat flour tortillas. Vegetables  Fresh or frozen vegetables (raw, steamed, roasted, or grilled). Low-sodium or reduced-sodium tomato and vegetable juice. Low-sodium or reduced-sodium tomato sauce and tomato paste. Low-sodium or reduced-sodium canned vegetables. Fruits  All fresh, dried, or frozen fruit. Canned fruit in natural juice (without added sugar). Meat and other protein foods  Skinless chicken or [place]. Ground chicken or [place]. Pork with fat trimmed off. Fish and seafood. Egg whites. Dried beans, peas, or lentils. Unsalted nuts, nut butters, and seeds. Unsalted canned beans. Lean cuts of beef with fat trimmed off. Low-sodium, lean deli meat. Dairy  Low-fat (1%) or fat-free (skim) milk. Fat-free, low-fat, or reduced-fat cheeses. Nonfat, low-sodium ricotta or cottage cheese. Low-fat or nonfat yogurt. Low-fat, low-sodium cheese. Fats and oils  Soft margarine without trans fats. Vegetable oil. Low-fat, reduced-fat, or light mayonnaise and salad dressings (reduced-sodium). Canola, safflower, olive, soybean, and sunflower oils. Avocado. Seasoning and other foods  Herbs. Spices. Seasoning mixes without salt. Unsalted popcorn and pretzels. Fat-free sweets. What foods are not recommended? The items listed may not be a complete list. Talk with your dietitian about what dietary choices are best for you. Grains  Baked goods made with fat, such as croissants, muffins, or some breads. Dry pasta or rice meal packs. Vegetables  Creamed or fried vegetables. Vegetables in a cheese sauce. Regular canned  vegetables (not low-sodium or reduced-sodium). Regular canned tomato sauce and paste (not low-sodium or reduced-sodium). Regular tomato and vegetable juice (not low-sodium or reduced-sodium). Rosita Fire. Olives. Fruits  Canned fruit in a light or heavy syrup. Fried fruit. Fruit in cream or butter sauce. Meat and other protein foods  Fatty cuts of meat. Ribs. Fried meat. Tomasa Blase. Sausage. Bologna and other processed lunch meats. Salami. Fatback. Hotdogs. Bratwurst. Salted nuts and seeds. Canned beans with added salt. Canned or smoked fish. Whole eggs or egg yolks. Chicken or [place] with skin. Dairy  Whole or 2% milk, cream, and half-and-half. Whole or full-fat cream cheese. Whole-fat or sweetened yogurt. Full-fat cheese. Nondairy creamers. Whipped toppings. Processed cheese and cheese spreads. Fats and oils  Butter. Stick margarine. Lard. Shortening. Ghee. Bacon fat. Tropical oils, such as coconut, palm kernel, or palm oil. Seasoning and other foods  Salted popcorn and pretzels. Onion salt, garlic salt, seasoned salt, table salt, and sea salt. Worcestershire sauce. Tartar sauce. Barbecue sauce. Teriyaki sauce. Soy sauce, including reduced-sodium. Steak sauce. Canned and packaged gravies. Fish sauce. Oyster sauce. Cocktail sauce. Horseradish that you find on the shelf. Ketchup. Mustard. Meat flavorings and tenderizers. Bouillon cubes. Hot sauce and  Tabasco sauce. Premade or packaged marinades. Premade or packaged taco seasonings. Relishes. Regular salad dressings. Where to find more information:  National Heart, Lung, and Blood Institute: PopSteam.is  American Heart Association: www.heart.org Summary  The DASH eating plan is a healthy eating plan that has been shown to reduce high blood pressure (hypertension). It may also reduce your risk for type 2 diabetes, heart disease, and stroke.  With the DASH eating plan, you should limit salt (sodium) intake to 2,300 mg a day. If you have hypertension,  you may need to reduce your sodium intake to 1,500 mg a day.  When on the DASH eating plan, aim to eat more fresh fruits and vegetables, whole grains, lean proteins, low-fat dairy, and heart-healthy fats.  Work with your health care provider or diet and nutrition specialist (dietitian) to adjust your eating plan to your individual calorie needs. This information is not intended to replace advice given to you by your health care provider. Make sure you discuss any questions you have with your health care provider. Document Released: 06/07/2011 Document Revised: 06/11/2016 Document Reviewed: 06/11/2016 Elsevier Interactive Patient Education  2017 [employer].

## 2016-10-13 LAB — HEMOGLOBIN A1C
Hgb A1c MFr Bld: 5.5 % (ref <5.7)
Mean Plasma Glucose: 111 mg/dL

## 2016-10-13 LAB — TSH: TSH: 1.45 mIU/L (ref 0.40–4.50)

## 2016-11-01 ENCOUNTER — Telehealth: Payer: Self-pay | Admitting: Gastroenterology

## 2016-11-01 NOTE — Telephone Encounter (Signed)
Please call patient on 11/02/2016 after 2:00 to schedule colonoscopy

## 2016-11-02 ENCOUNTER — Other Ambulatory Visit: Payer: Self-pay

## 2016-11-02 NOTE — Telephone Encounter (Signed)
Gastroenterology Pre-Procedure Review  Request Date: May 31 Requesting Physician: Dr. Tobi BastosAnna  PATIENT REVIEW QUESTIONS: The patient responded to the following health history questions as indicated:    1. Are you having any GI issues? no 2. Do you have a personal history of Polyps? no 3. Do you have a family history of Colon Cancer or Polyps? unsure 4. Diabetes Mellitus? no 5. Joint replacements in the past 12 months?no 6. Major health problems in the past 3 months?no 7. Any artificial heart valves, MVP, or defibrillator?no    MEDICATIONS & ALLERGIES:    Patient reports the following regarding taking any anticoagulation/antiplatelet therapy:   Plavix, Coumadin, Eliquis, Xarelto, Lovenox, Pradaxa, Brilinta, or Effient? no Aspirin? no  Patient confirms/reports the following medications:  Current Outpatient Prescriptions  Medication Sig Dispense Refill  . aspirin 81 MG tablet Take 81 mg by mouth daily.    . hydrochlorothiazide (HYDRODIURIL) 12.5 MG tablet Take 1 tablet (12.5 mg total) by mouth daily. 30 tablet 2   No current facility-administered medications for this visit.     Patient confirms/reports the following allergies:  No Known Allergies  No orders of the defined types were placed in this encounter.   AUTHORIZATION INFORMATION Primary Insurance: 1D#: Group #:  Secondary Insurance: 1D#: Group #:  SCHEDULE INFORMATION: Date:  Time: Location:

## 2016-11-02 NOTE — Telephone Encounter (Signed)
Duplicate message will call today.

## 2016-11-06 ENCOUNTER — Telehealth: Payer: Self-pay | Admitting: Gastroenterology

## 2016-11-06 NOTE — Telephone Encounter (Signed)
11/06/16 Spoke with Marena ChancyBianca at [employer] and Dr. Tobi BastosAnna is out of network. Dr. Servando SnareWohl is in network for Screening Colonoscopy [id number] / Z12.11. Ref #: [id number]

## 2016-11-09 ENCOUNTER — Telehealth: Payer: Self-pay

## 2016-11-09 ENCOUNTER — Other Ambulatory Visit: Payer: Self-pay

## 2016-11-09 DIAGNOSIS — Z1211 Encounter for screening for malignant neoplasm of colon: Secondary | ICD-10-CM

## 2016-11-09 NOTE — Telephone Encounter (Signed)
LVM to notify pt of location change for colonoscopy scheduled May 31st, 2018. Location and provider has changed because Dr. Tobi BastosAnna is not in Network for his insurance. Location is [facility] and provider: Dr. Servando SnareWohl

## 2016-11-12 ENCOUNTER — Telehealth: Payer: Self-pay | Admitting: Gastroenterology

## 2016-11-12 NOTE — Telephone Encounter (Signed)
11/12/16 [employer] automated system NO auth required for Screening Colonoscopy [id number] / Z12.11 Ref #: [id number]

## 2016-11-21 ENCOUNTER — Encounter: Payer: Self-pay | Admitting: *Deleted

## 2016-11-28 NOTE — Discharge Instructions (Signed)

## 2016-11-29 ENCOUNTER — Ambulatory Visit: Payer: Managed Care, Other (non HMO) | Admitting: Anesthesiology

## 2016-11-29 ENCOUNTER — Encounter
Admission: RE | Disposition: A | Discharge status: Home / Self Care | Payer: Self-pay | Source: Ambulatory Visit | Attending: Gastroenterology

## 2016-11-29 ENCOUNTER — Ambulatory Visit
Admission: RE | Admit: 2016-11-29 | Discharge: 2016-11-29 | Disposition: A | Discharge status: Home / Self Care | Payer: Managed Care, Other (non HMO) | Source: Ambulatory Visit | Attending: Gastroenterology | Admitting: Gastroenterology

## 2016-11-29 DIAGNOSIS — Z1211 Encounter for screening for malignant neoplasm of colon: Secondary | ICD-10-CM

## 2016-11-29 DIAGNOSIS — Z800 Family history of malignant neoplasm of digestive organs: Secondary | ICD-10-CM | POA: Insufficient documentation

## 2016-11-29 DIAGNOSIS — I10 Essential (primary) hypertension: Secondary | ICD-10-CM | POA: Insufficient documentation

## 2016-11-29 DIAGNOSIS — Z79899 Other long term (current) drug therapy: Secondary | ICD-10-CM | POA: Diagnosis not present

## 2016-11-29 DIAGNOSIS — Z8371 Family history of colonic polyps: Secondary | ICD-10-CM | POA: Insufficient documentation

## 2016-11-29 HISTORY — PX: COLONOSCOPY WITH PROPOFOL: SHX5780

## 2016-11-29 HISTORY — DX: Essential (primary) hypertension: I10

## 2016-11-29 SURGERY — COLONOSCOPY WITH PROPOFOL
Anesthesia: Monitor Anesthesia Care | Wound class: Contaminated

## 2016-11-29 MED ORDER — LACTATED RINGERS IV SOLN
INTRAVENOUS | Status: DC
  Administered 2016-11-29: 10:00:00 via INTRAVENOUS

## 2016-11-29 MED ORDER — STERILE WATER FOR IRRIGATION IR SOLN
Status: DC | PRN
  Administered 2016-11-29: 11:00:00

## 2016-11-29 MED ORDER — PROPOFOL 10 MG/ML IV BOLUS
INTRAVENOUS | Status: DC | PRN
  Administered 2016-11-29: 100 mg via INTRAVENOUS
  Administered 2016-11-29 (×3): 50 mg via INTRAVENOUS

## 2016-11-29 MED ORDER — LIDOCAINE HCL (CARDIAC) 20 MG/ML IV SOLN
INTRAVENOUS | Status: DC | PRN
  Administered 2016-11-29: 50 mg via INTRAVENOUS

## 2016-11-29 SURGICAL SUPPLY — 23 items

## 2016-11-29 NOTE — Anesthesia Postprocedure Evaluation (Signed)
Anesthesia Post Note  Patient: Ronald Sweeney  Procedure(s) Performed: Procedure(s) (LRB): COLONOSCOPY WITH PROPOFOL (N/A)  Patient location during evaluation: PACU Anesthesia Type: MAC Level of consciousness: awake and alert Pain management: pain level controlled Vital Signs Assessment: post-procedure vital signs reviewed and stable Respiratory status: spontaneous breathing, nonlabored ventilation, respiratory function stable and patient connected to nasal cannula oxygen Cardiovascular status: stable and blood pressure returned to baseline Anesthetic complications: no    Amaryllis Dyke

## 2016-11-29 NOTE — Op Note (Signed)
[facility] Gastroenterology Patient Name: Ronald SprinklesDexter Pherigo Procedure Date: 11/29/2016 11:07 AM MRN: [id number] Account #: [id number] Date of Birth: [birth year] Admit Type: Outpatient Age: 51 [facility] Gender: Male Note Status: Finalized Procedure:            Colonoscopy Indications:          Screening for colorectal malignant neoplasm Providers:            Midge Miniumarren Corbyn Steedman MD, MD Referring MD:         Filbert BertholdAmy L. Krebs (Referring MD) Medicines:            Propofol per Anesthesia Complications:        No immediate complications. Procedure:            Pre-Anesthesia Assessment:                       - Prior to the procedure, a History and Physical was                        performed, and patient medications and allergies were                        reviewed. The patient's tolerance of previous                        anesthesia was also reviewed. The risks and benefits of                        the procedure and the sedation options and risks were                        discussed with the patient. All questions were                        answered, and informed consent was obtained. Prior                        Anticoagulants: The patient has taken no previous                        anticoagulant or antiplatelet agents. ASA Grade                        Assessment: II - A patient with mild systemic disease.                        After reviewing the risks and benefits, the patient was                        deemed in satisfactory condition to undergo the                        procedure.                       After obtaining informed consent, the colonoscope was                        passed under direct vision. Throughout the procedure,  the patient's blood pressure, pulse, and oxygen                        saturations were monitored continuously. The Olympus                        190 Colonoscope [id number]) was introduced through the                   anus and advanced to the the cecum, identified by                        appendiceal orifice and ileocecal valve. The                        colonoscopy was performed without difficulty. The                        patient tolerated the procedure well. The quality of                        the bowel preparation was excellent. Findings:      The perianal and digital rectal examinations were normal.      The colon (entire examined portion) appeared normal. Impression:           - The entire examined colon is normal.                       - No specimens collected. Recommendation:       - Discharge patient to home.                       - Resume previous diet.                       - Continue present medications.                       - Repeat colonoscopy in 10 years for screening unless                        any change in family history or lower GI problems. Procedure Code(s):    --- Professional ---                       [phone], Colonoscopy, flexible; diagnostic, including                        collection of specimen(s) by brushing or washing, when                        performed (separate procedure) Diagnosis Code(s):    --- Professional ---                       Z12.11, Encounter for screening for malignant neoplasm                        of colon CPT copyright 2016 American Medical Association. All rights reserved. The codes documented in this report are preliminary and upon coder review may  be revised to meet current compliance requirements. Midge Minium MD, MD 11/29/2016 11:23:19 AM This report  has been signed electronically. Number of Addenda: 0 Note Initiated On: 11/29/2016 11:07 AM Scope Withdrawal Time: 0 hours 6 minutes 13 seconds  Total Procedure Duration: 0 hours 8 minutes 58 seconds       [facility]

## 2016-11-29 NOTE — H&P (Signed)
   Midge Miniumarren Jujhar Everett, MD [facility] [address] [place], [place] [address] Phone: [phone] Fax : [phone]  Primary Care Physician:  Galen ManilaKennedy, Lauren Renee, NP Primary Gastroenterologist:  Dr. Servando SnareWohl  Pre-Procedure History & Physical: HPI:  Ronald Sweeney is a 51 y.o. male is here for a screening colonoscopy.   Past Medical History:  Diagnosis Date  . Allergy    seasonal  . Hypertension     Past Surgical History:  Procedure Laterality Date  . NO PAST SURGERIES      Prior to Admission medications   Medication Sig Start Date End Date Taking? Authorizing Provider  hydrochlorothiazide (HYDRODIURIL) 12.5 MG tablet Take 1 tablet (12.5 mg total) by mouth daily. 10/12/16  Yes Galen ManilaKennedy, Lauren Renee, NP    Allergies as of 11/09/2016  . (No Known Allergies)    Family History  Problem Relation Age of Onset  . Cancer Mother        colon cancer  . Colon polyps Mother     Social History   Social History  . Marital status: Married    Spouse name: N/A  . Number of children: N/A  . Years of education: N/A   Occupational History  . Not on file.   Social History Main Topics  . Smoking status: Never Smoker  . Smokeless tobacco: Never Used  . Alcohol use 0.0 oz/week     Comment: occasional  . Drug use: No  . Sexual activity: Yes    Birth control/ protection: None   Other Topics Concern  . Not on file   Social History Narrative  . No narrative on file    Review of Systems: See HPI, otherwise negative ROS  Physical Exam: BP (!) 136/98   Pulse 99   Temp 98.2 F (36.8 C) (Temporal)   Ht 5\' 8"  (1.727 m)   Wt 186 lb (84.4 kg)   SpO2 97%   BMI 28.28 kg/m  General:   Alert,  pleasant and cooperative in NAD Head:  Normocephalic and atraumatic. Neck:  Supple; no masses or thyromegaly. Lungs:  Clear throughout to auscultation.    Heart:  Regular rate and rhythm. Abdomen:  Soft, nontender and nondistended. Normal bowel sounds, without guarding, and without  rebound.   Neurologic:  Alert and  oriented x4;  grossly normal neurologically.  Impression/Plan: Ronald Sweeney is now here to undergo a screening colonoscopy.  Risks, benefits, and alternatives regarding colonoscopy have been reviewed with the patient.  Questions have been answered.  All parties agreeable.

## 2016-11-29 NOTE — Transfer of Care (Signed)
Immediate Anesthesia Transfer of Care Note  Patient: Ronald Sweeney  Procedure(s) Performed: Procedure(s): COLONOSCOPY WITH PROPOFOL (N/A)  Patient Location: PACU  Anesthesia Type: MAC  Level of Consciousness: awake, alert  and patient cooperative  Airway and Oxygen Therapy: Patient Spontanous Breathing and Patient connected to supplemental oxygen  Post-op Assessment: Post-op Vital signs reviewed, Patient's Cardiovascular Status Stable, Respiratory Function Stable, Patent Airway and No signs of Nausea or vomiting  Post-op Vital Signs: Reviewed and stable  Complications: No apparent anesthesia complications

## 2016-11-29 NOTE — Anesthesia Preprocedure Evaluation (Signed)
Anesthesia Evaluation  Patient identified by MRN, date of birth, ID band Patient awake    Reviewed: Allergy & Precautions, H&P , NPO status   Airway Mallampati: II  TM Distance: >3 FB Neck ROM: full    Dental   Pulmonary    Pulmonary exam normal        Cardiovascular hypertension, Normal cardiovascular exam     Neuro/Psych    GI/Hepatic   Endo/Other    Renal/GU      Musculoskeletal   Abdominal   Peds  Hematology   Anesthesia Other Findings   Reproductive/Obstetrics                             Anesthesia Physical Anesthesia Plan  ASA: II  Anesthesia Plan: MAC   Post-op Pain Management:    Induction:   Airway Management Planned:   Additional Equipment:   Intra-op Plan:   Post-operative Plan:   Informed Consent: I have reviewed the patients History and Physical, chart, labs and discussed the procedure including the risks, benefits and alternatives for the proposed anesthesia with the patient or authorized representative who has indicated his/her understanding and acceptance.     Plan Discussed with: CRNA  Anesthesia Plan Comments:         Anesthesia Quick Evaluation

## 2016-11-30 ENCOUNTER — Encounter: Payer: Self-pay | Admitting: Gastroenterology

## 2017-01-11 ENCOUNTER — Ambulatory Visit: Payer: Managed Care, Other (non HMO) | Admitting: Nurse Practitioner

## 2017-01-15 ENCOUNTER — Encounter: Payer: Self-pay | Admitting: Nurse Practitioner

## 2017-01-15 ENCOUNTER — Ambulatory Visit (INDEPENDENT_AMBULATORY_CARE_PROVIDER_SITE_OTHER): Payer: Managed Care, Other (non HMO) | Admitting: Nurse Practitioner

## 2017-01-15 VITALS — BP 144/89 | HR 84 | Ht 68.0 in | Wt 192.2 lb

## 2017-01-15 DIAGNOSIS — M79641 Pain in right hand: Secondary | ICD-10-CM

## 2017-01-15 DIAGNOSIS — F419 Anxiety disorder, unspecified: Secondary | ICD-10-CM

## 2017-01-15 DIAGNOSIS — I10 Essential (primary) hypertension: Secondary | ICD-10-CM | POA: Diagnosis not present

## 2017-01-15 NOTE — Patient Instructions (Addendum)
Ronald Sweeney, Thank you for coming in to clinic today.  1. For your blood pressure: - Restart hydrochlorothiazide 12.5 mg once daily. After 2 weeks daily doses, come in for your nursing visit to have BP check.  2. For your night sweats and heart racing: - This is likely anxiety.  Work on Optician, dispensing.  Consider faith based meditation or other mindfulness strategies.   Please schedule a follow-up appointment with Ronald Sweeney, AGNP to Return in about 3 months (around 04/17/2017) for Blood Pressure and CMA visit in 2 weeks..  If you have any other questions or concerns, please feel free to call the clinic or send a message through MyChart. You may also schedule an earlier appointment if necessary.  Ronald Mcardle, DNP, AGNP-BC Adult Gerontology Nurse Practitioner [facility]    DASH Eating Plan DASH stands for "Dietary Approaches to Stop Hypertension." The DASH eating plan is a healthy eating plan that has been shown to reduce high blood pressure (hypertension). It may also reduce your risk for type 2 diabetes, heart disease, and stroke. The DASH eating plan may also help with weight loss. What are tips for following this plan? General guidelines  Avoid eating more than 2,300 mg (milligrams) of salt (sodium) a day. If you have hypertension, you may need to reduce your sodium intake to 1,500 mg a day.  Limit alcohol intake to no more than 1 drink a day for nonpregnant women and 2 drinks a day for men. One drink equals 12 oz of beer, 5 oz of wine, or 1 oz of hard liquor.  Work with your health care provider to maintain a healthy body weight or to lose weight. Ask what an ideal weight is for you.  Get at least 30 minutes of exercise that causes your heart to beat faster (aerobic exercise) most days of the week. Activities may include walking, swimming, or biking.  Work with your health care provider or diet and nutrition specialist (dietitian) to adjust your  eating plan to your individual calorie needs. Reading food labels  Check food labels for the amount of sodium per serving. Choose foods with less than 5 percent of the Daily Value of sodium. Generally, foods with less than 300 mg of sodium per serving fit into this eating plan.  To find whole grains, look for the word "whole" as the first word in the ingredient list. Shopping  Buy products labeled as "low-sodium" or "no salt added."  Buy fresh foods. Avoid canned foods and premade or frozen meals. Cooking  Avoid adding salt when cooking. Use salt-free seasonings or herbs instead of table salt or sea salt. Check with your health care provider or pharmacist before using salt substitutes.  Do not fry foods. Cook foods using healthy methods such as baking, boiling, grilling, and broiling instead.  Cook with heart-healthy oils, such as olive, canola, soybean, or sunflower oil. Meal planning   Eat a balanced diet that includes: ? 5 or more servings of fruits and vegetables each day. At each meal, try to fill half of your plate with fruits and vegetables. ? Up to 6-8 servings of whole grains each day. ? Less than 6 oz of lean meat, poultry, or fish each day. A 3-oz serving of meat is about the same size as a deck of cards. One egg equals 1 oz. ? 2 servings of low-fat dairy each day. ? A serving of nuts, seeds, or beans 5 times each week. ? Heart-healthy fats. Healthy fats  called Omega-3 fatty acids are found in foods such as flaxseeds and coldwater fish, like sardines, salmon, and mackerel.  Limit how much you eat of the following: ? Canned or prepackaged foods. ? Food that is high in trans fat, such as fried foods. ? Food that is high in saturated fat, such as fatty meat. ? Sweets, desserts, sugary drinks, and other foods with added sugar. ? Full-fat dairy products.  Do not salt foods before eating.  Try to eat at least 2 vegetarian meals each week.  Eat more home-cooked food and  less restaurant, buffet, and fast food.  When eating at a restaurant, ask that your food be prepared with less salt or no salt, if possible. What foods are recommended? The items listed may not be a complete list. Talk with your dietitian about what dietary choices are best for you. Grains Whole-grain or whole-wheat bread. Whole-grain or whole-wheat pasta. Brown rice. Orpah Cobbatmeal. Quinoa. Bulgur. Whole-grain and low-sodium cereals. Pita bread. Low-fat, low-sodium crackers. Whole-wheat flour tortillas. Vegetables Fresh or frozen vegetables (raw, steamed, roasted, or grilled). Low-sodium or reduced-sodium tomato and vegetable juice. Low-sodium or reduced-sodium tomato sauce and tomato paste. Low-sodium or reduced-sodium canned vegetables. Fruits All fresh, dried, or frozen fruit. Canned fruit in natural juice (without added sugar). Meat and other protein foods Skinless chicken or Malawiturkey. Ground chicken or Malawiturkey. Pork with fat trimmed off. Fish and seafood. Egg whites. Dried beans, peas, or lentils. Unsalted nuts, nut butters, and seeds. Unsalted canned beans. Lean cuts of beef with fat trimmed off. Low-sodium, lean deli meat. Dairy Low-fat (1%) or fat-free (skim) milk. Fat-free, low-fat, or reduced-fat cheeses. Nonfat, low-sodium ricotta or cottage cheese. Low-fat or nonfat yogurt. Low-fat, low-sodium cheese. Fats and oils Soft margarine without trans fats. Vegetable oil. Low-fat, reduced-fat, or light mayonnaise and salad dressings (reduced-sodium). Canola, safflower, olive, soybean, and sunflower oils. Avocado. Seasoning and other foods Herbs. Spices. Seasoning mixes without salt. Unsalted popcorn and pretzels. Fat-free sweets. What foods are not recommended? The items listed may not be a complete list. Talk with your dietitian about what dietary choices are best for you. Grains Baked goods made with fat, such as croissants, muffins, or some breads. Dry pasta or rice meal  packs. Vegetables Creamed or fried vegetables. Vegetables in a cheese sauce. Regular canned vegetables (not low-sodium or reduced-sodium). Regular canned tomato sauce and paste (not low-sodium or reduced-sodium). Regular tomato and vegetable juice (not low-sodium or reduced-sodium). Rosita FirePickles. Olives. Fruits Canned fruit in a light or heavy syrup. Fried fruit. Fruit in cream or butter sauce. Meat and other protein foods Fatty cuts of meat. Ribs. Fried meat. Tomasa BlaseBacon. Sausage. Bologna and other processed lunch meats. Salami. Fatback. Hotdogs. Bratwurst. Salted nuts and seeds. Canned beans with added salt. Canned or smoked fish. Whole eggs or egg yolks. Chicken or Malawiturkey with skin. Dairy Whole or 2% milk, cream, and half-and-half. Whole or full-fat cream cheese. Whole-fat or sweetened yogurt. Full-fat cheese. Nondairy creamers. Whipped toppings. Processed cheese and cheese spreads. Fats and oils Butter. Stick margarine. Lard. Shortening. Ghee. Bacon fat. Tropical oils, such as coconut, palm kernel, or palm oil. Seasoning and other foods Salted popcorn and pretzels. Onion salt, garlic salt, seasoned salt, table salt, and sea salt. Worcestershire sauce. Tartar sauce. Barbecue sauce. Teriyaki sauce. Soy sauce, including reduced-sodium. Steak sauce. Canned and packaged gravies. Fish sauce. Oyster sauce. Cocktail sauce. Horseradish that you find on the shelf. Ketchup. Mustard. Meat flavorings and tenderizers. Bouillon cubes. Hot sauce and Tabasco sauce. Premade or packaged marinades.  Premade or packaged taco seasonings. Relishes. Regular salad dressings. Where to find more information:  National Heart, Lung, and Blood Institute: PopSteam.is  American Heart Association: www.heart.org Summary  The DASH eating plan is a healthy eating plan that has been shown to reduce high blood pressure (hypertension). It may also reduce your risk for type 2 diabetes, heart disease, and stroke.  With the DASH eating  plan, you should limit salt (sodium) intake to 2,300 mg a day. If you have hypertension, you may need to reduce your sodium intake to 1,500 mg a day.  When on the DASH eating plan, aim to eat more fresh fruits and vegetables, whole grains, lean proteins, low-fat dairy, and heart-healthy fats.  Work with your health care provider or diet and nutrition specialist (dietitian) to adjust your eating plan to your individual calorie needs. This information is not intended to replace advice given to you by your health care provider. Make sure you discuss any questions you have with your health care provider. Document Released: 06/07/2011 Document Revised: 06/11/2016 Document Reviewed: 06/11/2016 Elsevier Interactive Patient Education  2017 [employer].

## 2017-01-15 NOTE — Assessment & Plan Note (Addendum)
Unchanged.  Pt uncontrolled and not at goal of less than 130/80. HCTZ 12.5 mg once daily prescribed. Pt not consistently taking medication w/ no doses in last 1.5 weeks.  Pt experiencing right hand pain possibly related to gout or self-limited injury.  Pt willing to restart HCTZ.  Plan: 1. Resume once daily dose of HCTZ 12.5 mg. 2. Encouraged continued physical activity. 3. Discussed DASH diet and low glycemic food choices as best carb choices (handouts provided). 4. CMA visit for BP check in 2 weeks after daily med doses.  Contact me if symptoms of hand or joint pain return prior to next appointment. 5. Follow up w/ lab check in 3 months or sooner if needed (determine after 2 week BP check).

## 2017-01-15 NOTE — Progress Notes (Signed)
I have reviewed this encounter including the documentation in this note and/or discussed this patient with the provider, Wilhelmina McardleLauren Kennedy, AGPCNP-BC. I am certifying that I agree with the content of this note as supervising physician.  Saralyn PilarAlexander Karamalegos, DO [facility] 01/15/2017, 1:30 PM

## 2017-01-15 NOTE — Progress Notes (Signed)
Subjective:    Patient ID: Ronald Sweeney, male    DOB: 1965/11/19, 51 y.o.   MRN: [id number]  Ronald Sweeney is a 51 y.o. male presenting on 01/15/2017 for Hypertension (constant throbbing mostly in the  right hand the pt contributed it to Blood pressure medication)   HPI   Hypertension He is not checking BP at home.     Current medications: HCTZ 12.5 mg, tolerating with side effects of right hand pain.  Pt was inconsistently taking med prior to symptoms of hand pain.  After hand pain, pt stopped taking for a few days to see if it went away w/ stopping medication and has been off 1.5 weeks now.  Pain has subsided.  No history of gout in past.  Pt denies headache, lightheadedness, dizziness, changes in vision, chest tightness/pressure, palpitations, leg swelling, sudden loss of speech or loss of consciousness.  He notes heart racing occasionally (see anxiety HPI).  Physical activity: plays basketball, rides bike, other exercise at least 30 minutes 4-5 days per week. Diet: has not made diet changes, but tries to reduce fried foods.  Pt notes father is on multiple hypertension meds.  Perception that he is too young to start chronic medical condition w/ daily meds.  Pt prefers no meds.  Throbbing pain right hand If gripping action or grip to twist had pain in right hand at metacarpals.  Noted mild pain in left hand, but worst in right. Pt noted worse symptoms w/ opening jar, gripping golf club, lifting weights.   Anxiety Pt complains of night sweats, wet pillow x approximately 6 months.  Uses 2 sides of 2 pillows each night.  Notes cool room at night.  Possibly correlated to financial stress/job stress that started about 6 months ago.   "feeling closed in" about his current job.  Pt also notes racing heart that resolves when he counts down to calm down.  He admits faith support system w/ encouragement for scripture meditation that has also helped him during this stressful time.      Social History  Substance Use Topics  . Smoking status: Never Smoker  . Smokeless tobacco: Never Used  . Alcohol use 0.0 oz/week     Comment: occasional    Review of Systems Per HPI unless specifically indicated above     Objective:    BP (!) 144/89 (BP Location: Right Arm, Patient Position: Sitting, Cuff Size: Large)   Pulse 84   Ht 5\' 8"  (1.727 m)   Wt 192 lb 3.2 oz (87.2 kg)   BMI 29.22 kg/m   Wt Readings from Last 3 Encounters:  01/15/17 192 lb 3.2 oz (87.2 kg)  11/29/16 186 lb (84.4 kg)  10/12/16 191 lb (86.6 kg)    Physical Exam  General - healthy, well-appearing, NAD HEENT - Normocephalic, atraumatic Neck - supple, non-tender, no LAD, no thyromegaly, no carotid bruit Heart - RRR, no murmurs heard Lungs - Clear throughout all lobes, no wheezing, crackles, or rhonchi. Normal work of breathing. Extremeties - non-tender, no edema, cap refill < 2 seconds, peripheral pulses intact +2 bilaterally Musculoskeletal - 5/5 grip strength bilat, brachioradialis DTR +2 bilat, no reproducible pain today  Skin - warm, dry Neuro - awake, alert, oriented x3,  normal gait Psych - Normal to anxious mood and affect, normal behavior    Results for orders placed or performed in visit on 10/12/16  Lipid panel  Result Value Ref Range   Cholesterol 185 <200 mg/dL   Triglycerides  97 <150 mg/dL   HDL 71 >16 mg/dL   Total CHOL/HDL Ratio 2.6 <5.0 Ratio   VLDL 19 <30 mg/dL   LDL Cholesterol 95 <109 mg/dL  COMPLETE METABOLIC PANEL WITH GFR  Result Value Ref Range   Sodium 140 135 - 146 mmol/L   Potassium 4.0 3.5 - 5.3 mmol/L   Chloride 103 98 - 110 mmol/L   CO2 23 20 - 31 mmol/L   Glucose, Bld 103 (H) 65 - 99 mg/dL   BUN 15 7 - 25 mg/dL   Creat 6.04 5.40 - 9.81 mg/dL   Total Bilirubin 0.7 0.2 - 1.2 mg/dL   Alkaline Phosphatase 42 40 - 115 U/L   AST 20 10 - 40 U/L   ALT 20 9 - 46 U/L   Total Protein 7.4 6.1 - 8.1 g/dL   Albumin 4.4 3.6 - 5.1 g/dL   Calcium 9.6 8.6 - 19.1  mg/dL   GFR, Est African American 81 >=60 mL/min   GFR, Est Non African American 70 >=60 mL/min  CBC  Result Value Ref Range   WBC 4.6 3.8 - 10.8 K/uL   RBC 5.46 4.20 - 5.80 MIL/uL   Hemoglobin 15.1 13.2 - 17.1 g/dL   HCT 47.8 29.5 - 62.1 %   MCV 83.2 80.0 - 100.0 fL   MCH 27.7 27.0 - 33.0 pg   MCHC 33.3 32.0 - 36.0 g/dL   RDW 30.8 65.7 - 84.6 %   Platelets 166 140 - 400 K/uL   MPV 10.0 7.5 - 12.5 fL  TSH  Result Value Ref Range   TSH 1.45 0.40 - 4.50 mIU/L  HgB A1c  Result Value Ref Range   Hgb A1c MFr Bld 5.5 <5.7 %   Mean Plasma Glucose 111 mg/dL      Assessment & Plan:   Problem List Items Addressed This Visit      Cardiovascular and Mediastinum   Essential hypertension - Primary    Unchanged.  Pt uncontrolled and not at goal of less than 130/80. HCTZ 12.5 mg once daily prescribed. Pt not consistently taking medication w/ no doses in last 1.5 weeks.  Pt experiencing right hand pain possibly related to gout or self-limited injury.  Pt willing to restart HCTZ.  Plan: 1. Resume once daily dose of HCTZ 12.5 mg. 2. Encouraged continued physical activity. 3. Discussed DASH diet and low glycemic food choices as best carb choices (handouts provided). 4. CMA visit for BP check in 2 weeks after daily med doses.  Contact me if symptoms of hand or joint pain return prior to next appointment. 5. Follow up w/ lab check in 3 months or sooner if needed (determine after 2 week BP check).       Other Visit Diagnoses    Anxious mood     Pt notes being worried and preoccupied over last 6 months. Corresponds to night sweats.  Racing heart rate influenced by stress and resolves w/ deep breathing/counting down as stress management tool.  Symptoms not affecting patient's ability to carry on daily activities or get along w/ other people.  Mild situational anxiety.  Plan: 1. Encouraged ongoing faith based meditation as desired. 2. Encouraged mindfulness strategies to help w/ identifying  feelings and source of feelings. 3. Consider counseling in future. 4. Follow up at next appointment in 3 months or sooner if needed.    Hand pain, right     Pain self-limited and resolved today.  Possible association to HCTZ w/ gout.  No  evidence of swollen joints today.  Pt willing to resume HCTZ (see hypertension).  Plan: 1. No action needed today.  Monitor for repeat symptoms and make medication changes as necessary. 2. Pt declines labs today.  Willing to check uric acid in future if other labs being collected or if symptoms return. 3. Follow up as needed.        Follow up plan: Return in about 3 months (around 04/17/2017) for Blood Pressure and CMA visit in 2 weeks.    Wilhelmina McardleLauren Geneva Pallas, DNP, AGPCNP-BC Adult Gerontology Primary Care Nurse Practitioner [facility] 01/15/2017, 8:45 AM'

## 2017-01-29 ENCOUNTER — Other Ambulatory Visit (INDEPENDENT_AMBULATORY_CARE_PROVIDER_SITE_OTHER): Payer: Managed Care, Other (non HMO)

## 2017-01-29 ENCOUNTER — Encounter: Payer: Self-pay | Admitting: Nurse Practitioner

## 2017-01-29 DIAGNOSIS — I10 Essential (primary) hypertension: Secondary | ICD-10-CM

## 2017-01-29 MED ORDER — HYDROCHLOROTHIAZIDE 25 MG PO TABS: 25.0000 mg | ORAL_TABLET | Freq: Every day | ORAL | 2 refills | Status: DC

## 2017-01-29 NOTE — Telephone Encounter (Addendum)
The pt was notified of the medication increase and the need to f/u in 2-3 weeks for a blood pressure check (Nurse visit). He said he will call back later today and schedule that appt.   Attempted to contact the pt, no answer. LMOM to return my call.    ----- Message from Galen ManilaLauren Renee Kennedy, NP sent at 01/29/2017 10:43 AM EDT ----- Regarding: FW: blood pressure check I am going to increase his hydrochlorothiazide to 25 mg once daily.  Please repeat BP check in 2-3 weeks.  ----- Message ----- From: Lonna Cobbussell, Finesse Fielder L, CMA Sent: 01/29/2017   8:57 AM To: Galen ManilaLauren Renee Kennedy, NP Subject: blood pressure check                           Pt came in the office today for a blood pressure recheck. His blood pressure today is 148/62.

## 2017-01-29 NOTE — Progress Notes (Signed)
The pt was seen at his dentist appt for teeth cleaning. He had his blood pressure check and it was elevated.

## 2017-01-29 NOTE — Addendum Note (Signed)
Addended by: Lonna CobbUSSELL, Malaki Koury L on: 01/29/2017 09:03 AM   Modules accepted: Level of Service

## 2017-04-23 ENCOUNTER — Ambulatory Visit: Payer: Managed Care, Other (non HMO) | Admitting: Nurse Practitioner

## 2017-05-13 ENCOUNTER — Ambulatory Visit: Payer: Managed Care, Other (non HMO) | Admitting: Nurse Practitioner

## 2017-05-29 ENCOUNTER — Ambulatory Visit: Payer: Managed Care, Other (non HMO) | Admitting: Nurse Practitioner

## 2017-09-26 ENCOUNTER — Telehealth: Payer: Self-pay | Admitting: Nurse Practitioner

## 2017-09-26 DIAGNOSIS — I10 Essential (primary) hypertension: Secondary | ICD-10-CM

## 2017-09-26 MED ORDER — HYDROCHLOROTHIAZIDE 25 MG PO TABS: 25.0000 mg | ORAL_TABLET | Freq: Every day | ORAL | 0 refills | Status: DC

## 2017-09-26 NOTE — Telephone Encounter (Signed)
[facility] [place]  Note pharmacy change.  The call back number is [phone]

## 2017-09-27 ENCOUNTER — Other Ambulatory Visit: Payer: Self-pay

## 2017-09-27 ENCOUNTER — Ambulatory Visit: Payer: Managed Care, Other (non HMO) | Admitting: Family Medicine

## 2017-09-30 ENCOUNTER — Encounter: Payer: Self-pay | Admitting: Nurse Practitioner

## 2017-09-30 ENCOUNTER — Ambulatory Visit: Payer: Managed Care, Other (non HMO) | Admitting: Nurse Practitioner

## 2017-09-30 ENCOUNTER — Other Ambulatory Visit: Payer: Self-pay

## 2017-09-30 ENCOUNTER — Ambulatory Visit (INDEPENDENT_AMBULATORY_CARE_PROVIDER_SITE_OTHER): Payer: 59 | Admitting: Nurse Practitioner

## 2017-09-30 VITALS — BP 161/99 | HR 88 | Temp 98.3°F | Ht 68.0 in | Wt 202.4 lb

## 2017-09-30 DIAGNOSIS — I10 Essential (primary) hypertension: Secondary | ICD-10-CM | POA: Diagnosis not present

## 2017-09-30 DIAGNOSIS — H1131 Conjunctival hemorrhage, right eye: Secondary | ICD-10-CM | POA: Diagnosis not present

## 2017-09-30 DIAGNOSIS — M25572 Pain in left ankle and joints of left foot: Secondary | ICD-10-CM

## 2017-09-30 MED ORDER — LISINOPRIL-HYDROCHLOROTHIAZIDE 20-25 MG PO TABS: 1.0000 | ORAL_TABLET | Freq: Every day | ORAL | 1 refills | Status: DC

## 2017-09-30 NOTE — Progress Notes (Signed)
Subjective:    Patient ID: Ronald Sweeney, male    DOB: Nov 10, 1965, 52 y.o.   MRN: [id number]  Ronald Sweeney is a 52 y.o. male presenting on 09/30/2017 for Eye Injury (pt states he dirt in his Rt eye x 5 days ago. ) and Ankle Pain (left ankle pain, twisted the ankle and fell over x 4 weeks. The pain worsen with certain movement and when he first get up in the morning.  )   HPI Eye injury Patient reports he got dirt in his eye Tuesday afternoon last week (6-7 days ago).  He was [place] a driving range and dirt was propelled upwards while swinging into his eye.  He had immediate foreign body sensation.  He flushed with water and the clubhouse, but foreign body sensation did not disappear.  He returned home at night and flushed with water again.  He also applied ice then heat via warm washcloth compress later that night.  Foreign body sensation resolved some by Wednesday morning.  Foreign body sensation continued to improve throughout the day on Wednesday and was absent on Thursday morning.  Hematoma developed on lateral half of right eyes sclera on Tuesday into Wednesday.  On Thursday, hematoma had affected entire sclera except for medial upper quadrant.  Patient's vision was never affected and no pain except for foreign body sensation.  Left ankle pain Approximately 4 weeks ago, patient twisted his ankle outside on the grass.  He has continued to ice intermittently after playing basketball.  He is regularly active and only notes continuing pain first thing in the morning stiffness and after playing basketball.  He has not worn compression wrap while being active to limit overextension.  Hypertension - He is not checking BP at home or outside of clinic.  Readings at a dentist office was SBP 170/80 approx - Current medications: hydrochlorothiazide 25 mg once daily, but pt does not take consistently, tolerating well without side effects - He is not currently symptomatic. - Pt denies headache,  lightheadedness, dizziness, changes in vision, chest tightness/pressure, palpitations, leg swelling, sudden loss of speech or loss of consciousness. - He  reports an exercise routine that includes Basketball/body weight exercises, 4-5 days per week. - His diet is moderate in salt, moderate in fat, and moderate in carbohydrates.  Patient admits he adheres to a low salt and healthier diet when he is exercising more.  Ankle injury has reduced physical activity and patient reports more dietary indiscretions over the last 4 weeks.   Social History   Tobacco Use  . Smoking status: Never Smoker  . Smokeless tobacco: Never Used  Substance Use Topics  . Alcohol use: Yes    Alcohol/week: 0.0 oz    Comment: occasional  . Drug use: No    Review of Systems Per HPI unless specifically indicated above     Objective:    BP (!) 161/99 (BP Location: Right Arm, Patient Position: Sitting, Cuff Size: Large)   Pulse 88   Temp 98.3 F (36.8 C) (Oral)   Ht 5\' 8"  (1.727 m)   Wt 202 lb 6.4 oz (91.8 kg)   BMI 30.77 kg/m   Wt Readings from Last 3 Encounters:  09/30/17 202 lb 6.4 oz (91.8 kg)  01/29/17 196 lb (88.9 kg)  01/15/17 192 lb 3.2 oz (87.2 kg)    Physical Exam  Constitutional: He is oriented to person, place, and time. He appears well-developed and well-nourished. No distress.  HENT:  Head: Normocephalic and atraumatic.  Eyes: Pupils are equal, round, and reactive to light. Conjunctivae and EOM are normal. Lids are everted and swept, no foreign bodies found. No foreign body present in the right eye. Left eye exhibits no chemosis, no discharge, no exudate and no hordeolum. No foreign body present in the left eye.    subconjuntival hemorrhage of R eye except for medial upper quadrant.  Nontender to palpation.  Visual acuity normal. Fluorescein staining performed: no corneal abrasion  Neck: Normal range of motion. Neck supple. Carotid bruit is not present.  Cardiovascular: Normal rate, regular  rhythm, S1 normal, S2 normal, normal heart sounds and intact distal pulses.  Pulmonary/Chest: Effort normal and breath sounds normal. No respiratory distress.  Musculoskeletal: He exhibits no edema (pedal).       Left ankle: Normal. He exhibits normal range of motion (Mild pain with inversion of foot), no swelling, no ecchymosis, no deformity, no laceration and normal pulse. No tenderness. Achilles tendon normal.  Neurological: He is alert and oriented to person, place, and time.  Skin: Skin is warm and dry.  Psychiatric: He has a normal mood and affect. His speech is normal and behavior is normal.  Vitals reviewed.    Results for orders placed or performed in visit on 10/12/16  Lipid panel  Result Value Ref Range   Cholesterol 185 <200 mg/dL   Triglycerides 97 <643 mg/dL   HDL 71 >32 mg/dL   Total CHOL/HDL Ratio 2.6 <5.0 Ratio   VLDL 19 <30 mg/dL   LDL Cholesterol 95 <951 mg/dL  COMPLETE METABOLIC PANEL WITH GFR  Result Value Ref Range   Sodium 140 135 - 146 mmol/L   Potassium 4.0 3.5 - 5.3 mmol/L   Chloride 103 98 - 110 mmol/L   CO2 23 20 - 31 mmol/L   Glucose, Bld 103 (H) 65 - 99 mg/dL   BUN 15 7 - 25 mg/dL   Creat 8.84 1.66 - 0.63 mg/dL   Total Bilirubin 0.7 0.2 - 1.2 mg/dL   Alkaline Phosphatase 42 40 - 115 U/L   AST 20 10 - 40 U/L   ALT 20 9 - 46 U/L   Total Protein 7.4 6.1 - 8.1 g/dL   Albumin 4.4 3.6 - 5.1 g/dL   Calcium 9.6 8.6 - 01.6 mg/dL   GFR, Est African American 81 >=60 mL/min   GFR, Est Non African American 70 >=60 mL/min  CBC  Result Value Ref Range   WBC 4.6 3.8 - 10.8 K/uL   RBC 5.46 4.20 - 5.80 MIL/uL   Hemoglobin 15.1 13.2 - 17.1 g/dL   HCT 01.0 93.2 - 35.5 %   MCV 83.2 80.0 - 100.0 fL   MCH 27.7 27.0 - 33.0 pg   MCHC 33.3 32.0 - 36.0 g/dL   RDW 73.2 20.2 - 54.2 %   Platelets 166 140 - 400 K/uL   MPV 10.0 7.5 - 12.5 fL  TSH  Result Value Ref Range   TSH 1.45 0.40 - 4.50 mIU/L  HgB A1c  Result Value Ref Range   Hgb A1c MFr Bld 5.5 <5.7 %    Mean Plasma Glucose 111 mg/dL      Assessment & Plan:   Problem List Items Addressed This Visit      Cardiovascular and Mediastinum   Essential hypertension - Primary    Unchanged.  Pt remains uncontrolled and not at goal of less than 130/80. Pt is only intermittently taking his hydrochlorothiazide.  He is expressing health belief that HTN will get  better and he can get off medications.  Some denial about accepting diagnosis of hypertension also present.  Plan: 1. CHANGE medication: take lisinopril - HCTZ 20-25 mg once daily.   Take daily.  Discussed pt does not need to take with food.  Pt established goal to take when brushing teeth in am. 2. Encouraged continued physical activity. 3. Discussed DASH diet and low glycemic food choices as best carb choices (handouts provided). 4. CMA visit for BP check in 2 weeks after daily med doses.  Labs 5. Labs today and in 2 weeks. 6. Followup 3 months.      Relevant Medications   aspirin 81 MG tablet   lisinopril-hydrochlorothiazide (PRINZIDE,ZESTORETIC) 20-25 MG tablet   Other Relevant Orders   COMPLETE METABOLIC PANEL WITH GFR    Other Visit Diagnoses    Subconjunctival hemorrhage, traumatic, right     Stable subconjunctival hemorrhage over last 6 days.  Pt without pain or change in visual acuity.  Trauma of foreign body likely was cause.  No identified foreign body remains in eye through fluorescein staining.  No corneal abrasion noted.  Plan: 1. Watchful waiting.  No current complications.  If any worsening, is best to see optometrist or ophthalmologist.  Reassured, will likely resolve in another 7-10 days. 2. May use ice for 5-10 minutes x 2 twice daily.  Avoid prolonged exposure to cold.  Avoid heat as this may exacerbate hemorrhage. 3. Followup as needed    Acute left ankle pain     Pain likely self-limited.  Muscle strain possible complicated by twisted ankle 4 weeks ago and continued muscle weakness/stiffness.  Plan:  1. Treat  with OTC pain meds (acetaminophen and ibuprofen).  Discussed alternate dosing and max dosing. 2. Apply heat and/or ice to affected area. 3. May also apply a muscle rub with lidocaine or lidocaine patch after heat or ice. 4. Wear compression wrap when playing basketball or doing other exercise activities to prevent reinjury. 5. Follow up 7-10 days as needed.        Meds ordered this encounter  Medications  . lisinopril-hydrochlorothiazide (PRINZIDE,ZESTORETIC) 20-25 MG tablet    Sig: Take 1 tablet by mouth daily.    Dispense:  90 tablet    Refill:  1    Order Specific Question:   Supervising Provider    Answer:   Smitty CordsKARAMALEGOS, ALEXANDER J [[id number]]    Follow up plan: Return in about 3 months (around 12/30/2017) for hypertension AND in 2 weeks for BP check with CMA.  Wilhelmina McardleLauren Romano Stigger, DNP, AGPCNP-BC Adult Gerontology Primary Care Nurse Practitioner [facility] 09/30/2017, 9:36 AM

## 2017-09-30 NOTE — Patient Instructions (Addendum)
Ronald Sweeney,   Thank you for coming in to clinic today.  1. For your eye, There is no corneal abrasion.  You have a hematoma that should resolve on its own.  If it worsens, see an eye doctor next.  If it does not resolve or improve in the next 7 days, please see the eye doctor. - May apply ice for 5-10 minutes x 2 twice daily for next 7 days.  Avoid warm compresses.  2. Ankle: Wear compression wrap with activity for next 7-10 days or until you have less soreness.  - Take acetaminophen 500 -650 mg every 8 hours for the next 7-10 days.  - Apply ice for 15 minutes, off 15 minutes and repeat twice daily for 7-10 days.  3. For your hypertension: Change medication to lisinopril-hydrochlorothiazide 20-25 mg once daily.  Take this every day.  Then, return to clinic in 2 weeks for labs only.  This does not have to be fasting.  Please schedule a follow-up appointment with Wilhelmina McardleLauren Zae Kirtz, AGNP. Return in about 3 months (around 12/30/2017) for hypertension AND in 2 weeks for BP check with CMA.  If you have any other questions or concerns, please feel free to call the clinic or send a message through MyChart. You may also schedule an earlier appointment if necessary.  You will receive a survey after today's visit either digitally by e-mail or paper by [employer] mail. Your experiences and feedback matter to us.  Please respond so we know how we are doing as we provide care for you.   Wilhelmina McardleLauren Totiana Everson, DNP, AGNP-BC Adult Gerontology Nurse Practitioner [facility], [facility]

## 2017-09-30 NOTE — Assessment & Plan Note (Signed)
Unchanged.  Pt remains uncontrolled and not at goal of less than 130/80. Pt is only intermittently taking his hydrochlorothiazide.  He is expressing health belief that HTN will get better and he can get off medications.  Some denial about accepting diagnosis of hypertension also present.  Plan: 1. CHANGE medication: take lisinopril - HCTZ 20-25 mg once daily.   Take daily.  Discussed pt does not need to take with food.  Pt established goal to take when brushing teeth in am. 2. Encouraged continued physical activity. 3. Discussed DASH diet and low glycemic food choices as best carb choices (handouts provided). 4. CMA visit for BP check in 2 weeks after daily med doses.  Labs 5. Labs today and in 2 weeks. 6. Followup 3 months.

## 2017-10-01 LAB — COMPLETE METABOLIC PANEL WITH GFR
AG Ratio: 1.8 (calc) (ref 1.0–2.5)
ALT: 24 U/L (ref 9–46)
AST: 20 U/L (ref 10–35)
Albumin: 4.5 g/dL (ref 3.6–5.1)
Alkaline phosphatase (APISO): 40 U/L (ref 40–115)
BUN: 14 mg/dL (ref 7–25)
CO2: 32 mmol/L (ref 20–32)
Calcium: 9.9 mg/dL (ref 8.6–10.3)
Chloride: 104 mmol/L (ref 98–110)
Creat: 1.13 mg/dL (ref 0.70–1.33)
GFR, Est African American: 87 mL/min/{1.73_m2} (ref >=60)
GFR, Est Non African American: 75 mL/min/{1.73_m2} (ref >=60)
Globulin: 2.5 g/dL (calc) (ref 1.9–3.7)
Glucose, Bld: 105 mg/dL — ABNORMAL HIGH (ref 65–99)
Potassium: 3.9 mmol/L (ref 3.5–5.3)
Sodium: 142 mmol/L (ref 135–146)
Total Bilirubin: 0.4 mg/dL (ref 0.2–1.2)
Total Protein: 7 g/dL (ref 6.1–8.1)

## 2017-10-21 ENCOUNTER — Encounter: Payer: Self-pay | Admitting: Nurse Practitioner

## 2017-10-21 ENCOUNTER — Other Ambulatory Visit: Payer: Self-pay

## 2017-10-21 ENCOUNTER — Ambulatory Visit (INDEPENDENT_AMBULATORY_CARE_PROVIDER_SITE_OTHER): Payer: 59 | Admitting: Nurse Practitioner

## 2017-10-21 VITALS — BP 137/81 | HR 86 | Temp 98.5°F | Ht 68.0 in | Wt 202.0 lb

## 2017-10-21 DIAGNOSIS — J00 Acute nasopharyngitis [common cold]: Secondary | ICD-10-CM

## 2017-10-21 DIAGNOSIS — I10 Essential (primary) hypertension: Secondary | ICD-10-CM | POA: Diagnosis not present

## 2017-10-21 NOTE — Assessment & Plan Note (Signed)
Improved and near goal despite taking OTC medications with decongestants. BP< 140/90, Goal < 130/80.  Pt is regularly taking medication at this time.  No new side effects noted with addition of lisinopril.  Also, is working to improve lifestyle again.  Plan: 1. Continue lisinopril - HCTZ 20-25 mg once daily.   2. Encouraged continued physical activity. 3. Discussed DASH diet and low glycemic food choices as best carb choices (handouts provided). 4. Labs today recheck kidney function. 5. Followup 3 months.

## 2017-10-21 NOTE — Progress Notes (Signed)
Subjective:    Patient ID: Ronald Sweeney, male    DOB: 24-Nov-1965, 52 y.o.   MRN: [id number]  Ronald Sweeney is a 52 y.o. male presenting on 10/21/2017 for Hypertension and Cough (nasal congestion, chest congestion , and productive coughing mostly at bedtime)   HPI Hypertension - He is not checking BP at home or outside of clinic.    - Current medications: HCTZ and lisinopril HCTZ 20-25 mg once daily.  Pt did not stop taking HCTZ alone as requested, tolerating well without side effects - He is not currently symptomatic. - Pt denies headache, lightheadedness, dizziness, changes in vision, chest tightness/pressure, palpitations, leg swelling, sudden loss of speech or loss of consciousness. - He  reports an exercise routine that includes walking, basketball, golf, 3-4 days per week 1.5 hours per session. - His diet is moderate in salt, moderate in fat, and moderate in carbohydrates.  Last 1.5 weeks has reduced serving sizes, less salt.    URI Pt presents with mostly resolving URI symptoms that have included nasal congestion, productive cough worst at bedtime.  Symptoms have been present for about 7 days and are mostly improving.   - Cough and rhinorrhea are persistent.  No tooth/jaw pain, no ear pressure/pain/fullness.  Coughing made chest tender, but that is resolving.   - Pt has been taking OTC Mucinex - Plain, and Dayquil /Nyquil. - Sick contact with daughter in last week.  She is also getting better without prescription medication  Social History   Tobacco Use  . Smoking status: Never Smoker  . Smokeless tobacco: Never Used  Substance Use Topics  . Alcohol use: Yes    Alcohol/week: 0.0 oz    Comment: occasional  . Drug use: No    Review of Systems Per HPI unless specifically indicated above     Objective:    BP 137/81 (BP Location: Right Arm, Patient Position: Sitting, Cuff Size: Large)   Pulse 86   Temp 98.5 F (36.9 C) (Oral)   Ht 5\' 8"  (1.727 m)   Wt 202 lb  (91.6 kg)   BMI 30.71 kg/m   Wt Readings from Last 3 Encounters:  10/21/17 202 lb (91.6 kg)  09/30/17 202 lb 6.4 oz (91.8 kg)  01/29/17 196 lb (88.9 kg)    Physical Exam  Constitutional: He is oriented to person, place, and time. He appears well-developed and well-nourished. No distress.  HENT:  Head: Normocephalic and atraumatic.  Right Ear: Hearing, tympanic membrane, external ear and ear canal normal.  Left Ear: Hearing, tympanic membrane, external ear and ear canal normal.  Nose: Mucosal edema and rhinorrhea present. Right sinus exhibits no maxillary sinus tenderness and no frontal sinus tenderness. Left sinus exhibits no maxillary sinus tenderness and no frontal sinus tenderness.  Mouth/Throat: Uvula is midline and mucous membranes are normal. Posterior oropharyngeal edema (cobblestoning) and posterior oropharyngeal erythema (mildly injected) present. Oropharyngeal exudate: clear secretions. Tonsils are 0 on the right. Tonsils are 0 on the left.  Neck: Normal range of motion. Neck supple.  Cardiovascular: Normal rate, regular rhythm, S1 normal, S2 normal, normal heart sounds and intact distal pulses.  Pulmonary/Chest: Effort normal and breath sounds normal. No respiratory distress.  Lymphadenopathy:    He has cervical adenopathy.  Neurological: He is alert and oriented to person, place, and time.  Skin: Skin is warm and dry.  Psychiatric: He has a normal mood and affect. His behavior is normal.  Vitals reviewed.   Results for orders placed or performed  in visit on 09/30/17  COMPLETE METABOLIC PANEL WITH GFR  Result Value Ref Range   Glucose, Bld 105 (H) 65 - 99 mg/dL   BUN 14 7 - 25 mg/dL   Creat 1.61 0.96 - 0.45 mg/dL   GFR, Est Non African American 75 > OR = 60 mL/min/1.23m2   GFR, Est African American 87 > OR = 60 mL/min/1.75m2   BUN/Creatinine Ratio NOT APPLICABLE 6 - 22 (calc)   Sodium 142 135 - 146 mmol/L   Potassium 3.9 3.5 - 5.3 mmol/L   Chloride 104 98 - 110 mmol/L     CO2 32 20 - 32 mmol/L   Calcium 9.9 8.6 - 10.3 mg/dL   Total Protein 7.0 6.1 - 8.1 g/dL   Albumin 4.5 3.6 - 5.1 g/dL   Globulin 2.5 1.9 - 3.7 g/dL (calc)   AG Ratio 1.8 1.0 - 2.5 (calc)   Total Bilirubin 0.4 0.2 - 1.2 mg/dL   Alkaline phosphatase (APISO) 40 40 - 115 U/L   AST 20 10 - 35 U/L   ALT 24 9 - 46 U/L      Assessment & Plan:   Problem List Items Addressed This Visit      Cardiovascular and Mediastinum   Essential hypertension - Primary    Improved and near goal despite taking OTC medications with decongestants. BP< 140/90, Goal < 130/80.  Pt is regularly taking medication at this time.  No new side effects noted with addition of lisinopril.  Also, is working to improve lifestyle again.  Plan: 1. Continue lisinopril - HCTZ 20-25 mg once daily.   2. Encouraged continued physical activity. 3. Discussed DASH diet and low glycemic food choices as best carb choices (handouts provided). 4. Labs today recheck kidney function. 5. Followup 3 months.      Relevant Orders   BASIC METABOLIC PANEL WITH GFR    Other Visit Diagnoses    Acute nasopharyngitis        Acute illness. Fever responsive to NSAIDs and tylenol.  Symptoms not worsening. Consistent with viral illness x 7 days with known sick contacts and no identifiable focal infections of ears, nose, throat.  Plan: 1. Reassurance, likely self-limited with cough lasting up to few weeks - Continue anti-histamine Cetirizine 10mg  daily,  - also can use Flonase 2 sprays each nostril daily for up to 4-6 weeks - Start Mucinex-DM OTC up to 7-10 days then stop - AVOID all decongestants in OTC medications 2. Supportive care with nasal saline, warm herbal tea with honey, 3. Improve hydration 4. Tylenol / Motrin PRN fevers 5. Return criteria given    Follow up plan: Return in about 3 months (around 01/20/2018) for hypertension.  Wilhelmina Mcardle, DNP, AGPCNP-BC Adult Gerontology Primary Care Nurse Practitioner [facility] 10/21/2017, 9:49 AM

## 2017-10-21 NOTE — Patient Instructions (Addendum)
Ronald Sweeney,   Thank you for coming in to clinic today.  1. It sounds like you have a Upper Respiratory Virus - this will most likely run it's course in 7 to 10 days. Recommend good hand washing. - Start anti-histamine cetirizine 10mg  daily, also can use Flonase 2 sprays each nostril daily for up to 4-6 weeks - If congestion is worse, start OTC Mucinex (or may try Mucinex-DM for cough) up to 7-10 days then stop - AVOID all over and behind the counter decongestants - Check active ingredients - Drink plenty of fluids to improve congestion - You may try over the counter Nasal Saline spray (Simply Saline, Ocean Spray) as needed to reduce congestion. - Drink warm herbal tea with honey for sore throat. - Start taking Tylenol extra strength 1 to 2 tablets every 6-8 hours for aches or fever/chills for next few days as needed.  Do not take more than 3,000 mg in 24 hours from all medicines.  May take Ibuprofen as well if tolerated 200-400mg  every 8 hours as needed.  If symptoms significantly worsening with persistent fevers/chills despite tylenol/ibpurofen, nausea, vomiting unable to tolerate food/fluids or medicine, body aches, or shortness of breath, sinus pain pressure or worsening productive cough, then follow-up for re-evaluation, may seek more immediate care at Urgent Care or ED if more concerned for emergency.  - Continue lisinopril and hydrochlorothiazide combination only for blood pressure. - May continue aspirin.  Please schedule a follow-up appointment with Wilhelmina McardleLauren Dacen Frayre, AGNP. Return in about 3 months (around 01/20/2018) for hypertension.  If you have any other questions or concerns, please feel free to call the clinic or send a message through MyChart. You may also schedule an earlier appointment if necessary.  You will receive a survey after today's visit either digitally by e-mail or paper by [employer] mail. Your experiences and feedback matter to us.  Please respond so we know how we are  doing as we provide care for you.   Wilhelmina McardleLauren Tarun Patchell, DNP, AGNP-BC Adult Gerontology Nurse Practitioner [facility], [facility]

## 2017-10-22 LAB — BASIC METABOLIC PANEL WITH GFR
BUN: 15 mg/dL (ref 7–25)
CO2: 32 mmol/L (ref 20–32)
Calcium: 9.8 mg/dL (ref 8.6–10.3)
Chloride: 103 mmol/L (ref 98–110)
Creat: 1.23 mg/dL (ref 0.70–1.33)
GFR, Est African American: 79 mL/min/{1.73_m2} (ref >=60)
GFR, Est Non African American: 68 mL/min/{1.73_m2} (ref >=60)
Glucose, Bld: 107 mg/dL — ABNORMAL HIGH (ref 65–99)
Potassium: 3.8 mmol/L (ref 3.5–5.3)
Sodium: 140 mmol/L (ref 135–146)

## 2018-01-17 ENCOUNTER — Ambulatory Visit (INDEPENDENT_AMBULATORY_CARE_PROVIDER_SITE_OTHER): Payer: 59 | Admitting: Family Medicine

## 2018-01-17 ENCOUNTER — Encounter: Payer: Self-pay | Admitting: Family Medicine

## 2018-01-17 VITALS — BP 127/72 | HR 76 | Temp 98.1°F | Resp 18 | Ht 68.0 in | Wt 192.4 lb

## 2018-01-17 DIAGNOSIS — R05 Cough: Secondary | ICD-10-CM

## 2018-01-17 DIAGNOSIS — S2341XA Sprain of ribs, initial encounter: Secondary | ICD-10-CM | POA: Diagnosis not present

## 2018-01-17 DIAGNOSIS — I10 Essential (primary) hypertension: Secondary | ICD-10-CM | POA: Diagnosis not present

## 2018-01-17 DIAGNOSIS — T464X5A Adverse effect of angiotensin-converting-enzyme inhibitors, initial encounter: Principal | ICD-10-CM

## 2018-01-17 MED ORDER — LOSARTAN POTASSIUM 50 MG PO TABS: 50.0000 mg | ORAL_TABLET | Freq: Every day | ORAL | 2 refills | Status: DC

## 2018-01-17 MED ORDER — HYDROCHLOROTHIAZIDE 25 MG PO TABS: 25.0000 mg | ORAL_TABLET | Freq: Every day | ORAL | 2 refills | Status: DC

## 2018-01-17 NOTE — Assessment & Plan Note (Addendum)
Well-controlled HTN on current med Lisinopril-HCTZ, however now concern ACEi-cough for 3 months - Home BP readings improved     Plan:  1. DISCONTINUE Lisinopril-HCTZ 20-25mg  - START Losartan 50mg  daily - START HCTZ 25mg  daily - Discussed medication changes, dosing regimen, potential side effects with Losartan reviewed potential angioedema risks and also reviewed prior FDA recalls on manufacturer - Anticipate cough will resolve spontaneously within few days to weeks off Lisinopril - Follow-up 6 wk to 3 month with PCP to review BP monitoring, med adjust - may need higher dose Losartan in future

## 2018-01-17 NOTE — Patient Instructions (Addendum)
Thank you for coming to the office today.  You most likely have an ACE inhibitor cough this is a common side effect of the Lisinopril medicine - now we will stop it and switch to Losartan 50mg  daily.  In the future, if this does not quite work after 2-4 weeks, then we can reconsider the other options discussed today such as trial of an antibiotic or we can consider acid reflux treatments for possible silent reflux as a possibility.  Now if you notice sinus drainage and congestion or drainage in back of throat then we may need to try a nasal steroid spray such as Flonase this can help reduce this and may trigger a cough and tickle in back of throat.  Please schedule a Follow-up Appointment to: Return in about 6 weeks (around 02/28/2018) for HTN follow-up med adjust (off ACEi due to cough).  If you have any other questions or concerns, please feel free to call the office or send a message through MyChart. You may also schedule an earlier appointment if necessary.  Additionally, you may be receiving a survey about your experience at our office within a few days to 1 week by e-mail or mail. We value your feedback.  Saralyn PilarAlexander Tyronne Blann, DO [facility]

## 2018-01-17 NOTE — Progress Notes (Signed)
Subjective:    Patient ID: Ronald Sweeney, male    DOB: 04-05-1966, 52 y.o.   MRN: [id number]  Ronald Sweeney is a 52 y.o. male presenting on 01/17/2018 for Cough (intermittent cough, productive cough, chest congestion x 2 mths The pt states it a tingling feeling in his throat that makes him have coughing  ) and Chest Pain (pt feels like he might have pulled a muscle. Pt feels like a pinch sensation underneath the right breast x 1.5 weeks)  Patient's regular PCP is Wilhelmina Mcardle, AGPCNP-BC. He is accompanied by his wife today during the visit.  HPI   PERSISTENT COUGH / Suspected ACEi-induced cough / Right upper rib strain Reports that he has been dealing with lingering throat and cough symptoms for past 3 months. He describes this seems different from regular cold or sore throat. Seems to have been gradually worsening with more persistence. He has dry cough with coughing spells that seem to start with a "tickle or tingling" in back of throat and he tries to avoid it but will then usually have a few minute coughing spell, often forceful coughing, limited productive sputum but has been able to cough some up with thicker sputum. This can happen anytime, if resting or if outdoors, not triggered by activity or exertion or outdoor allergens including lawn mowing. Does happen at night as well. He has no known history of asthma or COPD or other pulmonary problem. 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 - he has tried several medicines such as OTC med Mucinex DM, NyQuil, DayQuil, Delsym and other OTC cough treatments without any success - He has not felt very sick but this was bothersome. Now he was prompted to come in it seemed to be worse and he felt like more "chest congestion" that he could feel but he was not sure. - he is very active generally speaking, plays basketball, running and jogging and plays golf - Additional new concern with past 1-2 weeks he  felt R upper side and anterior rib discomfort, usually does not bother him but certain positions or movements will trigger this problem with a sharp pain or catch then it improves, it has occurred at night before when changing positions, he thinks may be related to cough  Regarding his cough - related history he was started on Lisinopril for first time on 09/30/17 due to HTN and suboptimal BP control. Previously was only on HCTZ. He was then transitioned to a combo pill Lisinopril-HCTZ 20-25mg  daily - he did well on this and is still taking it and his BP is improved. Of note there was a time when he took that combo pill and old HCTZ together mistakenly.  Denies any sinus pain or pressure, ear pressure, sinus drainage, fever or chills, sweats, hemoptysis, wheezing, dyspnea, chest pressure, rash, sick contacts, throat or lip swelling   Depression screen [facility] 2/9 10/12/2016 12/09/2015  Decreased Interest 0 0  Down, Depressed, Hopeless 0 0  PHQ - 2 Score 0 0    Social History   Tobacco Use  . Smoking status: Never Smoker  . Smokeless tobacco: Never Used  Substance Use Topics  . Alcohol use: Yes    Alcohol/week: 0.0 oz    Comment: occasional  . Drug use: No    Review of Systems Per HPI unless specifically indicated above     Objective:    BP 127/72 (BP Location: Left Arm, Patient Position: Sitting, Cuff  Size: Normal)   Pulse 76   Temp 98.1 F (36.7 C) (Oral)   Resp 18   Ht 5\' 8"  (1.727 m)   Wt 192 lb 6.4 oz (87.3 kg)   SpO2 100%   BMI 29.25 kg/m   Wt Readings from Last 3 Encounters:  01/17/18 192 lb 6.4 oz (87.3 kg)  10/21/17 202 lb (91.6 kg)  09/30/17 202 lb 6.4 oz (91.8 kg)    Physical Exam  Constitutional: He is oriented to person, place, and time. He appears well-developed and well-nourished. No distress.  Well-appearing, comfortable, cooperative  HENT:  Head: Normocephalic and atraumatic.  Mouth/Throat: Oropharynx is clear and moist.  Frontal / maxillary sinuses  non-tender. Nares patent without purulence or edema. Bilateral TMs clear without erythema, effusion or bulging. Oropharynx clear without erythema, exudates, edema or asymmetry.  Eyes: Conjunctivae are normal. Right eye exhibits no discharge. Left eye exhibits no discharge.  Neck: Normal range of motion. Neck supple. No thyromegaly present.  Cardiovascular: Normal rate, regular rhythm, normal heart sounds and intact distal pulses.  No murmur heard. Pulmonary/Chest: Effort normal and breath sounds normal. No respiratory distress. He has no wheezes. He has no rales.  Good air movement. No abnormal lung sounds. He self provokes a few coughing spells during exam but no otherwise regular coughing spells.  Musculoskeletal: Normal range of motion. He exhibits no edema.  Lymphadenopathy:    He has no cervical adenopathy.  Neurological: He is alert and oriented to person, place, and time.  Skin: Skin is warm and dry. No rash noted. He is not diaphoretic. No erythema.  Psychiatric: He has a normal mood and affect. His behavior is normal.  Well groomed, good eye contact, normal speech and thoughts  Nursing note and vitals reviewed.  Results for orders placed or performed in visit on 10/21/17  BASIC METABOLIC PANEL WITH GFR  Result Value Ref Range   Glucose, Bld 107 (H) 65 - 99 mg/dL   BUN 15 7 - 25 mg/dL   Creat 4.09 8.11 - 9.14 mg/dL   GFR, Est Non African American 68 > OR = 60 mL/min/1.36m2   GFR, Est African American 79 > OR = 60 mL/min/1.24m2   BUN/Creatinine Ratio NOT APPLICABLE 6 - 22 (calc)   Sodium 140 135 - 146 mmol/L   Potassium 3.8 3.5 - 5.3 mmol/L   Chloride 103 98 - 110 mmol/L   CO2 32 20 - 32 mmol/L   Calcium 9.8 8.6 - 10.3 mg/dL      Assessment & Plan:   Problem List Items Addressed This Visit    Problem List Items Addressed This Visit    Essential hypertension    Well-controlled HTN on current med Lisinopril-HCTZ, however now concern ACEi-cough for 3 months - Home BP  readings improved     Plan:  1. DISCONTINUE Lisinopril-HCTZ 20-25mg  - START Losartan 50mg  daily - START HCTZ 25mg  daily - Discussed medication changes, dosing regimen, potential side effects with Losartan reviewed potential angioedema risks and also reviewed prior FDA recalls on manufacturer - Anticipate cough will resolve spontaneously within few days to weeks off Lisinopril - Follow-up 6 wk to 3 month with PCP to review BP monitoring, med adjust - may need higher dose Losartan in future      Relevant Medications   losartan (COZAAR) 50 MG tablet   hydrochlorothiazide (HYDRODIURIL) 25 MG tablet    Other Visit Diagnoses    ACE-inhibitor cough    -  Primary   Suspected secondary to  lisinopril-HCTZ combo pill - DC this today, see A&P for HTN, switch to ARB - Losartan and HCTZ single pills, f/u if not improve  Additionally we reviewed other potential coughing mechanisms ONLY if cough does not improve after few weeks or if worsening or new symptoms - Possible rhinitis causing post nasal drainage may need a trial of Flonase if likely allergy mediated triggering coughing spells - Possible infectious / bronchitis - seems unlikely clinical but we could try an antibiotic if unresolved still would consider Azithromycin Z-pak. May reconsider CXR but currently clear lungs - Possible LPR or Silent Reflux GERD - with throat symptoms may be triggering cough, we could consider a trial of PPI to see if improves    Relevant Medications   losartan (COZAAR) 50 MG tablet   Sprain of costal cartilage, initial encounter       R upper localized area, positional symptoms are consistent - Likely secondary to frequent forceful coughing spells, try conservative treatment heating pad, muscle rub, Tylenol ibuprofen PRN       Meds ordered this encounter  Medications  . losartan (COZAAR) 50 MG tablet    Sig: Take 1 tablet (50 mg total) by mouth daily.    Dispense:  30 tablet    Refill:  2  .  hydrochlorothiazide (HYDRODIURIL) 25 MG tablet    Sig: Take 1 tablet (25 mg total) by mouth daily.    Dispense:  30 tablet    Refill:  2    Follow up plan: Return in about 6 weeks (around 02/28/2018) for HTN follow-up med adjust (off ACEi due to cough).   Saralyn PilarAlexander Karamalegos, DO [facility] 01/17/2018, 5:22 PM

## 2019-08-03 ENCOUNTER — Ambulatory Visit: Payer: 59 | Attending: Internal Medicine

## 2019-08-03 DIAGNOSIS — Z20822 Contact with and (suspected) exposure to covid-19: Secondary | ICD-10-CM

## 2019-08-04 LAB — NOVEL CORONAVIRUS, NAA: SARS-CoV-2, NAA: NOT DETECTED

## 2020-10-12 ENCOUNTER — Encounter: Payer: Self-pay | Admitting: Family Medicine

## 2020-10-12 ENCOUNTER — Other Ambulatory Visit: Payer: Self-pay

## 2020-10-12 ENCOUNTER — Other Ambulatory Visit: Payer: Self-pay | Admitting: Family Medicine

## 2020-10-12 ENCOUNTER — Ambulatory Visit: Payer: 59 | Admitting: Family Medicine

## 2020-10-12 VITALS — BP 150/94 | HR 86 | Ht 68.5 in | Wt 189.6 lb

## 2020-10-12 DIAGNOSIS — I10 Essential (primary) hypertension: Secondary | ICD-10-CM | POA: Diagnosis not present

## 2020-10-12 DIAGNOSIS — Z0000 Encounter for general adult medical examination without abnormal findings: Secondary | ICD-10-CM

## 2020-10-12 DIAGNOSIS — R310 Gross hematuria: Secondary | ICD-10-CM

## 2020-10-12 DIAGNOSIS — R7309 Other abnormal glucose: Secondary | ICD-10-CM

## 2020-10-12 DIAGNOSIS — Z114 Encounter for screening for human immunodeficiency virus [HIV]: Secondary | ICD-10-CM

## 2020-10-12 DIAGNOSIS — Z1159 Encounter for screening for other viral diseases: Secondary | ICD-10-CM

## 2020-10-12 DIAGNOSIS — Z125 Encounter for screening for malignant neoplasm of prostate: Secondary | ICD-10-CM

## 2020-10-12 LAB — POCT URINALYSIS DIPSTICK
Bilirubin, UA: NEGATIVE
Glucose, UA: NEGATIVE
Ketones, UA: NEGATIVE
Nitrite, UA: NEGATIVE
Protein, UA: POSITIVE — AB
Spec Grav, UA: 1.02 (ref 1.010–1.025)
Urobilinogen, UA: 0.2 E.U./dL
pH, UA: 6 (ref 5.0–8.0)

## 2020-10-12 NOTE — Patient Instructions (Addendum)
Thank you for coming to the office today.  Referral sent, they will call you . Keep track of symptoms.  [facility] -1st floor [address] [place],  [place]  [address] Phone: [phone]  Sending out urine to check again under microscope and urine culture.  Check BP outside office a few times a week, record readings, goal < 140 / 90   DUE for FASTING BLOOD WORK (no food or drink after midnight before the lab appointment, only water or coffee without cream/sugar on the morning of)  SCHEDULE "Lab Only" visit in the morning at the clinic for lab draw in 3 MONTHS   - Make sure Lab Only appointment is at about 1 week before your next appointment, so that results will be available  For Lab Results, once available within 2-3 days of blood draw, you can can log in to MyChart online to view your results and a brief explanation. Also, we can discuss results at next follow-up visit.   Please schedule a Follow-up Appointment to: Return in about 3 months (around 01/11/2021), or if symptoms worsen or fail to improve, for 3 month fasting lab only then 1 week later Annual Physical.  If you have any other questions or concerns, please feel free to call the office or send a message through MyChart. You may also schedule an earlier appointment if necessary.  Additionally, you may be receiving a survey about your experience at our office within a few days to 1 week by e-mail or mail. We value your feedback.  Saralyn Pilar, DO [facility], [place]

## 2020-10-12 NOTE — Progress Notes (Signed)
Subjective:    Patient ID: Ronald Sweeney, male    DOB: 02/04/1966, 55 y.o.   MRN: [id number]  Ronald Sweeney is a 55 y.o. male presenting on 10/12/2020 for Hematuria and Back Pain   HPI   Abnormal Urine Color / Dehydration Reports last week on Thursday 10/06/20, he reported that he walked 18 holes of golf and drank some flavored beer and was not drinking as much water. When he got home, describes urine was darker deep orange color, he felt dehydrated. He drank more water throughout the evening and next few days - his urine was very clear, without any problem. Next few days went by, and one morning he drank coffee and 1 cup of water, then did walking with push mower, he had again darker orange/red urine color. He has been watching urine since that time and it has improved with increased water intake, now more hyper-aware of his urine color, has not had any problem until earlier this week. - Denies any symptoms of dysuria, urinary discomfort or pain, flank or abdominal pain, nausea vomiting, dysfunctional voiding / frequency.   CHRONIC HTN: Reports previously on Lisiniopril earlier than 2019, had ACEi cough taken off this and switched to Losartan HCTZ in 2019 by me, he has since discontinued medicine Current Meds - None   Denies CP, dyspnea, HA, edema, dizziness / lightheadedness   Health Maintenance: Return for physical  Depression screen [facility] 2/9 10/12/2016 12/09/2015  Decreased Interest 0 0  Down, Depressed, Hopeless 0 0  PHQ - 2 Score 0 0    Social History   Tobacco Use  . Smoking status: Never Smoker  . Smokeless tobacco: Never Used  Substance Use Topics  . Alcohol use: Yes    Alcohol/week: 0.0 standard drinks    Comment: occasional  . Drug use: No    Review of Systems Per HPI unless specifically indicated above     Objective:    BP (!) 150/94 (BP Location: Left Arm, Cuff Size: Normal)   Pulse 86   Ht 5' 8.5" (1.74 m)   Wt 189 lb 9.6 oz (86 kg)   SpO2 100%    BMI 28.41 kg/m   Wt Readings from Last 3 Encounters:  10/12/20 189 lb 9.6 oz (86 kg)  01/17/18 192 lb 6.4 oz (87.3 kg)  10/21/17 202 lb (91.6 kg)    Physical Exam Vitals and nursing note reviewed.  Constitutional:      General: He is not in acute distress.    Appearance: He is well-developed. He is not diaphoretic.     Comments: Well-appearing, comfortable, cooperative  HENT:     Head: Normocephalic and atraumatic.  Eyes:     General:        Right eye: No discharge.        Left eye: No discharge.     Conjunctiva/sclera: Conjunctivae normal.  Cardiovascular:     Rate and Rhythm: Normal rate.  Pulmonary:     Effort: Pulmonary effort is normal.  Skin:    General: Skin is warm and dry.     Findings: No erythema or rash.  Neurological:     Mental Status: He is alert and oriented to person, place, and time.  Psychiatric:        Behavior: Behavior normal.     Comments: Well groomed, good eye contact, normal speech and thoughts       Results for orders placed or performed in visit on 10/12/20  POCT Urinalysis Dipstick  Result Value Ref Range   Color, UA yellow    Clarity, UA cloudy    Glucose, UA Negative Negative   Bilirubin, UA Negative    Ketones, UA Negative    Spec Grav, UA 1.020 1.010 - 1.025   Blood, UA Large +++    pH, UA 6.0 5.0 - 8.0   Protein, UA Positive (A) Negative   Urobilinogen, UA 0.2 0.2 or 1.0 E.U./dL   Nitrite, UA Negative    Leukocytes, UA Moderate (2+) (A) Negative   Appearance     Odor        Assessment & Plan:   Problem List Items Addressed This Visit    Essential hypertension    Other Visit Diagnoses    Gross hematuria    -  Primary   Relevant Orders   POCT Urinalysis Dipstick (Completed)   Urine Culture   Urine Microscopic   Ambulatory referral to Urology      #Gross Hematuria Recent onset within past 1 week with multiple episodes of gross hematuria, abnormal dark urine seems only provoked after physical exertion walking  distances plus dehydration or reduced water intake. Otherwise has improved. - Asymptomatic with these episodes - Age >50 and non smoker. No history of nephrolithiasis  Most likely is benign transient due to physical exertion/hydration.  Also concern with HTN may be uncontrolled.  Check UA dipstick today, leuks and large blood.  Send out for Urine Microscopy and Urine Culture.  Referral to [facility] today for gross hematuria work up as discussed may warrant CT imaging / cystoscopy if indicated based on his history and confirmatory urine results.   #HTN Uncontrolled, elevated BP today, acutely stressed and worried about the above problem. Off medications losartan, HCTZ since last visit 3 years ago ACEi- cough and has not wanted to take med since Lifestyle had improved now, goal to get back on track Repeat BP today improved still elevated Check outside office, record readings Follow-up w/ physical and labs in 3 months  Orders Placed This Encounter  Procedures  . Urine Culture  . Urine Microscopic  . Ambulatory referral to Urology    Referral Priority:   Routine    Referral Type:   Consultation    Referral Reason:   Specialty Services Required    Requested Specialty:   Urology    Number of Visits Requested:   1  . POCT Urinalysis Dipstick     No orders of the defined types were placed in this encounter.     Follow up plan: Return in about 3 months (around 01/11/2021), or if symptoms worsen or fail to improve, for 3 month fasting lab only then 1 week later Annual Physical.  Future labs ordered for 12/2020   Saralyn Pilar, DO [facility] 10/12/2020, 8:37 AM

## 2020-10-13 LAB — URINALYSIS, MICROSCOPIC ONLY
Bacteria, UA: NONE SEEN /HPF
Hyaline Cast: NONE SEEN /LPF
Squamous Epithelial / HPF: NONE SEEN /HPF (ref <=5)
WBC, UA: NONE SEEN /HPF (ref 0–5)

## 2020-10-13 LAB — URINE CULTURE
MICRO NUMBER:: 11766693
Result:: NO GROWTH
SPECIMEN QUALITY:: ADEQUATE

## 2020-10-18 NOTE — Progress Notes (Signed)
10/19/2020  11:31 AM   Ronald Sweeney [birth year] [id number]  Referring provider: Smitty Cords, DO [address] [place],  [place] [address] Chief Complaint  Patient presents with  . Hematuria    HPI: Ronald Sweeney is a 55 y.o. male with a personal history of essential hypertension, who presents today for gross and microscopic hematuria. Patient was accompanied by his wife today.  Patient was recently seen on 4/13 by Dr. Althea Charon for gross hematuria x1 week. Patient reported abnormal dark urine which seemed to only be provoked by physical exercise, walking long distances, and reduced fluid intake.  Urine culture was negative from 10/12/2020.  Urine microscopic from 10/12/2020 showed 3-10 RBC.  POCT UA dipstick from 10/12/2020 showed positive protein, and moderate WBC (2+).  Reports that 2 weeks ago he was playing golf with his son, walking the course. He said that he was dehydrated. He came home and saw that his urine was dark amber. After drinking some water, and went to urinate in the night, and this color cleared up throughout the day. On Thursday, he went to golf again, and forgot to drink any fluids, then about 3 holes in he went to urinate and it was that same amber-ish color again. After coming home, he urinated again and it was still the same color as on the course. He drank some water and it became clear again. On Saturday, he mowed the yard, and came back inside, urinated and it was slightly discolored again. But it has been normal since then (4/9). He said that he does spin classes 3 times a week. He went before the first episode. He has not yet been to spin class this week.   Today, he says that he feels good and wanted to get back into his active lifestyle because he hasn't workout out in about 1.5 weeks.   Denies burning or dysuria. Has some flank pain. Never experienced kidney stones. Never xposed to any industrial chemicals that he knows of.  Never  smoker.   PMH: Past Medical History:  Diagnosis Date  . Allergy    seasonal  . Hypertension     Surgical History: Past Surgical History:  Procedure Laterality Date  . COLONOSCOPY WITH PROPOFOL N/A 11/29/2016   Procedure: COLONOSCOPY WITH PROPOFOL;  Surgeon: Midge Minium, MD;  [facility];  Service: Endoscopy;  Laterality: N/A;  . NO PAST SURGERIES      Home Medications:  Allergies as of 10/19/2020      Reactions   Shellfish Allergy Shortness Of Breath, Swelling      Medication List       Accurate as of October 19, 2020 11:31 AM. If you have any questions, ask your nurse or doctor.        STOP taking these medications   hydrochlorothiazide 25 MG tablet Commonly known as: HYDRODIURIL Stopped by: Vanna Scotland, MD   losartan 50 MG tablet Commonly known as: COZAAR Stopped by: Vanna Scotland, MD   naproxen sodium 220 MG tablet Commonly known as: ALEVE Stopped by: Vanna Scotland, MD     TAKE these medications   aspirin 81 MG tablet Take 81 mg by mouth daily.       Allergies: Allergies  Allergen Reactions  . Shellfish Allergy Shortness Of Breath and Swelling    Family History: Family History  Problem Relation Age of Onset  . Cancer Mother        colon cancer  . Colon polyps Mother  Social History:   reports that he has never smoked. He has never used smokeless tobacco. He reports current alcohol use. He reports that he does not use drugs.  ROS: Pertinent ROS in HPI.  Physical Exam: BP (!) 201/111   Pulse 80   Ht 5' 8.5" (1.74 m)   Wt 189 lb (85.7 kg)   BMI 28.32 kg/m   Constitutional:  Alert and oriented, No acute distress.  Accompanied by wife today.  Somewhat anxious.   HEENT: [place] AT, moist mucus membranes.  Trachea midline, no masses. Cardiovascular: No clubbing, cyanosis, or edema. Respiratory: Normal respiratory effort, no increased work of breathing. Skin: No rashes, bruises or suspicious lesions. Neurologic: Grossly  intact, no focal deficits, moving all 4 extremities. Psychiatric: Normal mood and affect.  Laboratory Data:  Lab Results  Component Value Date   CREATININE 1.23 10/21/2017    Lab Results  Component Value Date   PSA 1.49 01/10/2016    I have reviewed the labs.  Urinalysis Negative today  I have reviewed the labs.   Assessment & Plan:    1. Hematuria Although his gross/microscopic hematuria has resolved today, he did experience this on several occasions and did have microscopic blood in his urine at his primary care physician.  As such, would recommend work-up.  Given the gross hematuria as well as his age, he falls into the high risk category.  We discussed the differential diagnosis for microscopic hematuria including nephrolithiasis, renal or upper tract tumors, bladder stones, UTIs, or bladder tumors as well as undetermined etiologies. Per [facility] guidelines, I did recommend complete microscopic hematuria evaluation including CTU, possible urine cytology, and office cystoscopy.   2.  Essential hypertension Patient's blood pressure was elevated today but he is otherwise asymptomatic  He does mention today that he is had a friend of his take his blood pressure on several occasions with systolics in the 180s, repeat in the 160s.  He was previously on a medication that gave him a cough.  I have asked him to follow-up with his primary care physician is a strongly feel that he likely needs to be on medication to control his blood pressure.  Follow Up: Order CTU and cysto  I, Wyn Quaker, am acting as a scribe for Dr. Vanna Scotland.   I have reviewed the above documentation for accuracy and completeness, and I agree with the above.   Vanna Scotland, MD   [facility] [address] [place], [place] [address] [phone]

## 2020-10-19 ENCOUNTER — Other Ambulatory Visit: Payer: Self-pay

## 2020-10-19 ENCOUNTER — Ambulatory Visit: Payer: No Typology Code available for payment source | Admitting: Urology

## 2020-10-19 ENCOUNTER — Encounter: Payer: Self-pay | Admitting: Urology

## 2020-10-19 VITALS — BP 201/111 | HR 80 | Ht 68.5 in | Wt 189.0 lb

## 2020-10-19 DIAGNOSIS — R310 Gross hematuria: Secondary | ICD-10-CM

## 2020-10-19 NOTE — Patient Instructions (Signed)
Cystoscopy Cystoscopy is a procedure that is used to help diagnose and sometimes treat conditions that affect the lower urinary tract. The lower urinary tract includes the bladder and the urethra. The urethra is the tube that drains urine from the bladder. Cystoscopy is done using a thin, tube-shaped instrument with a light and camera at the end (cystoscope). The cystoscope may be hard or flexible, depending on the goal of the procedure. The cystoscope is inserted through the urethra, into the bladder. Cystoscopy may be recommended if you have:  Urinary tract infections that keep coming back.  Blood in the urine (hematuria).  An inability to control when you urinate (urinary incontinence) or an overactive bladder.  Unusual cells found in a urine sample.  A blockage in the urethra, such as a urinary stone.  Painful urination.  An abnormality in the bladder found during an intravenous pyelogram (IVP) or CT scan. Cystoscopy may also be done to remove a sample of tissue to be examined under a microscope (biopsy). What are the risks? Generally, this is a safe procedure. However, problems may occur, including:  Infection.  Bleeding.  What happens during the procedure?  1. You will be given one or more of the following: ? A medicine to numb the area (local anesthetic). 2. The area around the opening of your urethra will be cleaned. 3. The cystoscope will be passed through your urethra into your bladder. 4. Germ-free (sterile) fluid will flow through the cystoscope to fill your bladder. The fluid will stretch your bladder so that your health care provider can clearly examine your bladder walls. 5. Your doctor will look at the urethra and bladder. 6. The cystoscope will be removed The procedure may vary among health care providers  What can I expect after the procedure? After the procedure, it is common to have: 1. Some soreness or pain in your abdomen and urethra. 2. Urinary symptoms.  These include: ? Mild pain or burning when you urinate. Pain should stop within a few minutes after you urinate. This may last for up to 1 week. ? A small amount of blood in your urine for several days. ? Feeling like you need to urinate but producing only a small amount of urine. Follow these instructions at home: General instructions  Return to your normal activities as told by your health care provider.   Do not drive for 24 hours if you were given a sedative during your procedure.  Watch for any blood in your urine. If the amount of blood in your urine increases, call your health care provider.  If a tissue sample was removed for testing (biopsy) during your procedure, it is up to you to get your test results. Ask your health care provider, or the department that is doing the test, when your results will be ready.  Drink enough fluid to keep your urine pale yellow.  Keep all follow-up visits as told by your health care provider. This is important. Contact a health care provider if you:  Have pain that gets worse or does not get better with medicine, especially pain when you urinate.  Have trouble urinating.  Have more blood in your urine. Get help right away if you:  Have blood clots in your urine.  Have abdominal pain.  Have a fever or chills.  Are unable to urinate. Summary  Cystoscopy is a procedure that is used to help diagnose and sometimes treat conditions that affect the lower urinary tract.  Cystoscopy is done using   a thin, tube-shaped instrument with a light and camera at the end.  After the procedure, it is common to have some soreness or pain in your abdomen and urethra.  Watch for any blood in your urine. If the amount of blood in your urine increases, call your health care provider.  If you were prescribed an antibiotic medicine, take it as told by your health care provider. Do not stop taking the antibiotic even if you start to feel better. This  information is not intended to replace advice given to you by your health care provider. Make sure you discuss any questions you have with your health care provider. Document Revised: 06/10/2018 Document Reviewed: 06/10/2018 Elsevier Patient Education  2020 [employer]   

## 2020-10-21 ENCOUNTER — Ambulatory Visit: Payer: No Typology Code available for payment source | Admitting: Family Medicine

## 2020-10-21 ENCOUNTER — Other Ambulatory Visit: Payer: Self-pay

## 2020-10-21 ENCOUNTER — Encounter: Payer: Self-pay | Admitting: Family Medicine

## 2020-10-21 VITALS — BP 174/96 | HR 81 | Ht 68.5 in | Wt 191.0 lb

## 2020-10-21 DIAGNOSIS — I10 Essential (primary) hypertension: Secondary | ICD-10-CM | POA: Diagnosis not present

## 2020-10-21 LAB — URINALYSIS, COMPLETE
Bilirubin, UA: NEGATIVE
Glucose, UA: NEGATIVE
Ketones, UA: NEGATIVE
Leukocytes,UA: NEGATIVE
Nitrite, UA: NEGATIVE
Protein,UA: NEGATIVE
Specific Gravity, UA: <1.005 — ABNORMAL LOW (ref 1.005–1.030)
Urobilinogen, Ur: 0.2 mg/dL (ref 0.2–1.0)
pH, UA: 6 (ref 5.0–7.5)

## 2020-10-21 LAB — MICROSCOPIC EXAMINATION
Bacteria, UA: NONE SEEN
Epithelial Cells (non renal): NONE SEEN /hpf (ref 0–10)

## 2020-10-21 MED ORDER — AMLODIPINE BESYLATE 10 MG PO TABS
10.0000 mg | ORAL_TABLET | Freq: Every day | ORAL | 3 refills | Status: DC
Start: 2020-10-21 — End: 2021-02-14

## 2020-10-21 NOTE — Progress Notes (Signed)
Subjective:    Patient ID: Ronald Sweeney, male    DOB: 1965/11/08, 55 y.o.   MRN: [id number]  Ronald Sweeney is a 55 y.o. male presenting on 10/21/2020 for Hypertension   HPI   CHRONIC HTN: Reports previously on Lisinopril earlier than 2019, had ACEi cough taken off this and switched to Losartan HCTZ in 2019 by me, he has since discontinued medicine  Last visit with me 10/12/20 see note, he had hematuria, was referred to urology and also evaluated elevated BP. At the Urologist office up to 201/111. Advised to return for BP. No further blood in urine. Has upcoming testing per Urology. He was cleared with exercising and working out. He has played golf some but not walking as much. No further acute dehydration episodes.  Additional factors - stress with job lately. He does take in decaff. Occasionally alcohol intake but now eliminated in diet.  Current Meds - None currently  Denies CP, dyspnea, HA, edema, dizziness / lightheadedness  Updates from Urology, last visit now 10/19/20 after recent referral   Depression screen [facility] 2/9 10/12/2016 12/09/2015  Decreased Interest 0 0  Down, Depressed, Hopeless 0 0  PHQ - 2 Score 0 0    Social History   Tobacco Use  . Smoking status: Never Smoker  . Smokeless tobacco: Never Used  Substance Use Topics  . Alcohol use: Yes    Alcohol/week: 0.0 standard drinks    Comment: occasional  . Drug use: No    Review of Systems Per HPI unless specifically indicated above     Objective:    BP (!) 174/96   Pulse 81   Ht 5' 8.5" (1.74 m)   Wt 191 lb (86.6 kg)   BMI 28.62 kg/m   Wt Readings from Last 3 Encounters:  10/21/20 191 lb (86.6 kg)  10/19/20 189 lb (85.7 kg)  10/12/20 189 lb 9.6 oz (86 kg)    Physical Exam Results for orders placed or performed in visit on 10/19/20  Microscopic Examination   Urine  Result Value Ref Range   WBC, UA 0-5 0 - 5 /hpf   RBC 0-2 0 - 2 /hpf   Epithelial Cells (non renal) None seen 0 - 10 /hpf    Bacteria, UA None seen None seen/Few  Urinalysis, Complete  Result Value Ref Range   Specific Gravity, UA <1.005 (L) 1.005 - 1.030   pH, UA 6.0 5.0 - 7.5   Color, UA Yellow Yellow   Appearance Ur Clear Clear   Leukocytes,UA Negative Negative   Protein,UA Negative Negative/Trace   Glucose, UA Negative Negative   Ketones, UA Negative Negative   RBC, UA Trace (A) Negative   Bilirubin, UA Negative Negative   Urobilinogen, Ur 0.2 0.2 - 1.0 mg/dL   Nitrite, UA Negative Negative   Microscopic Examination See below:       Assessment & Plan:   Problem List Items Addressed This Visit    Essential hypertension - Primary    Uncontrolled HTN Multiple readings confirmed No known complications  Failed Lisinopril ACEi-Cough   Plan:  1. START Amlodipine 10mg  daily, counseling on potential side effects swelling etc, reviewed benefits, goals of treatment 2. Encourage improved lifestyle - low sodium diet, regular exercise 3. Start monitor BP outside office, bring readings to next visit, if persistently >140/90 or new symptoms notify office sooner 4. Follow-up 6 wks or sooner if elevated BP still, may consider add low dose HCTZ, 3 months if doing well.  Relevant Medications   amLODipine (NORVASC) 10 MG tablet      Meds ordered this encounter  Medications  . amLODipine (NORVASC) 10 MG tablet    Sig: Take 1 tablet (10 mg total) by mouth daily.    Dispense:  30 tablet    Refill:  3      Follow up plan: Return in about 3 months (around 01/20/2021) for 3 month HTN med adjust.   Saralyn Pilar, DO [facility] 10/21/2020, 1:59 PM

## 2020-10-21 NOTE — Patient Instructions (Addendum)
Thank you for coming to the office today.  Start Amlodipine 10mg  daily in morning.  Possible lower extremity edema swelling.  Use RICE therapy: - R - Rest / relative rest with activity modification avoid overuse of joint - I - Ice packs (make sure you use a towel or sock / something to protect skin) - C - Compression with compression socks/stockings vs ACE wrap to apply pressure and reduce swelling allowing more support - E - Elevation - if significant swelling, lift leg above heart level (toes above your nose) to help reduce swelling, most helpful at night after day of being on your feet  If BP on this med is good but not great, so example 150s-160s over 90-100s. We can return sooner if needed by 6 weeks.  Goal < 140 / 90 , and the perfect goal is < 135/85   Please schedule a Follow-up Appointment to: Return in about 3 months (around 01/20/2021) for 3 month HTN med adjust.  If you have any other questions or concerns, please feel free to call the office or send a message through MyChart. You may also schedule an earlier appointment if necessary.  Additionally, you may be receiving a survey about your experience at our office within a few days to 1 week by e-mail or mail. We value your feedback.  01/22/2021, DO [facility], [facility]

## 2020-10-22 NOTE — Assessment & Plan Note (Signed)
Uncontrolled HTN Multiple readings confirmed No known complications  Failed Lisinopril ACEi-Cough   Plan:  1. START Amlodipine 10mg  daily, counseling on potential side effects swelling etc, reviewed benefits, goals of treatment 2. Encourage improved lifestyle - low sodium diet, regular exercise 3. Start monitor BP outside office, bring readings to next visit, if persistently >140/90 or new symptoms notify office sooner 4. Follow-up 6 wks or sooner if elevated BP still, may consider add low dose HCTZ, 3 months if doing well.

## 2020-11-22 ENCOUNTER — Other Ambulatory Visit: Payer: Self-pay | Admitting: Urology

## 2020-11-23 ENCOUNTER — Other Ambulatory Visit: Payer: Self-pay

## 2020-11-23 ENCOUNTER — Encounter: Payer: Self-pay | Admitting: Urology

## 2020-11-23 ENCOUNTER — Ambulatory Visit: Payer: No Typology Code available for payment source | Admitting: Urology

## 2020-11-23 VITALS — BP 194/91 | HR 91 | Ht 68.5 in | Wt 191.0 lb

## 2020-11-23 DIAGNOSIS — N201 Calculus of ureter: Secondary | ICD-10-CM

## 2020-11-23 DIAGNOSIS — E279 Disorder of adrenal gland, unspecified: Secondary | ICD-10-CM

## 2020-11-23 DIAGNOSIS — R310 Gross hematuria: Secondary | ICD-10-CM | POA: Diagnosis not present

## 2020-11-23 DIAGNOSIS — E278 Other specified disorders of adrenal gland: Secondary | ICD-10-CM | POA: Diagnosis not present

## 2020-11-23 NOTE — Progress Notes (Signed)
 11/23/2020 11:25 AM   Ronald Sweeney 11/21/1965 [id number]  Referring provider: Karamalegos, Alexander J, DO [address] [place],  [place] [address]  Chief Complaint  Patient presents with  . Cysto    HPI: 55-year-old male with episodic painless gross hematuria who presents today for follow-up.  Since last visit, he underwent CT urogram which indicates an incompletely characterized 1 cm small left adrenal nodule as well as moderate left hydroureteronephrosis with a result of a 0.8 cm proximal left ureteral calculus.  Is also a punctate left kidney stone.  He has an incompletely characterized subcentimeter hypodensity in the left kidney, probable cyst.  No other pathology other than the aforementioned noted.  Stone to skin distance 12 cm, 840 Hounsfield units.  He reports that he has had no further episodes of gross hematuria since last visit.  He does have some low back pain especially when he changes position which is dull aching nonspecific and across the entirety of his low back.  No overt left flank pain.  No history of kidney stones prior to this.  He was seen and evaluated by his primary care physician, started back on amlodipine for hypertension.   PMH: Past Medical History:  Diagnosis Date  . Allergy    seasonal  . Hypertension     Surgical History: Past Surgical History:  Procedure Laterality Date  . COLONOSCOPY WITH PROPOFOL N/A 11/29/2016   Procedure: COLONOSCOPY WITH PROPOFOL;  Surgeon: Wohl, Darren, MD;  Location: [facility];  Service: Endoscopy;  Laterality: N/A;  . NO PAST SURGERIES      Home Medications:  Allergies as of 11/23/2020      Reactions   Shellfish Allergy Shortness Of Breath, Swelling      Medication List       Accurate as of Nov 23, 2020 11:25 AM. If you have any questions, ask your nurse or doctor.        amLODipine 10 MG tablet Commonly known as: NORVASC Take 1 tablet (10 mg total) by mouth daily.   aspirin 81 MG  tablet Take 81 mg by mouth daily.       Allergies:  Allergies  Allergen Reactions  . Shellfish Allergy Shortness Of Breath and Swelling    Family History: Family History  Problem Relation Age of Onset  . Cancer Mother        colon cancer  . Colon polyps Mother     Social History:  reports that he has never smoked. He has never used smokeless tobacco. He reports current alcohol use. He reports that he does not use drugs.   Physical Exam: BP (!) 194/91   Pulse 91   Ht 5' 8.5" (1.74 m)   Wt 191 lb (86.6 kg)   BMI 28.62 kg/m   Constitutional:  Alert and oriented, No acute distress.  Accompanied by his wife today. HEENT:  AT, moist mucus membranes.  Trachea midline, no masses. Cardiovascular: No clubbing, cyanosis, or edema. Respiratory: Normal respiratory effort, no increased work of breathing. Skin: No rashes, bruises or suspicious lesions. Neurologic: Grossly intact, no focal deficits, moving all 4 extremities. Psychiatric: Normal mood and affect.  Laboratory Data: Lab Results  Component Value Date   WBC 4.6 10/12/2016   HGB 15.1 10/12/2016   HCT 45.4 10/12/2016   MCV 83.2 10/12/2016   PLT 166 10/12/2016    Lab Results  Component Value Date   CREATININE 1.23 10/21/2017    Lab Results  Component Value Date     PSA 1.49 01/10/2016    Lab Results  Component Value Date   HGBA1C 5.5 10/12/2016   Pertinent Imaging: Performed by [id number] IMPRESSION:  1. Positive for moderate left obstructive uropathy as a result of a 0.8 cm proximal left ureteral calculus. Additional small calculus is present within the left kidney. Probable bilateral renal cysts and an incompletely characterized small cortically-based 0.4 cm hypodensity involving the posterior left kidney.   2. Diverticulosis without focal diverticulitis.   3. Incompletely characterized 1 cm small left adrenal nodule. Recommend attention on follow-up imaging.   4. Additional findings as detailed above.    Electronically Signed by: Leane Para on 11/21/2020 4:14 PM   The above CT scan was without today on disc.  I personally loaded the images onto my computer and reviewed the images.  Agree with radiologic interpretation.  Assessment & Plan:    1. Gross hematuria Likely secondary to acute stone episode  No further episodes of gross hematuria.  As such, we will hold off on cystoscopy today but consider completing work-up down the road if he has another recurrent episode of gross hematuria or microscopic hematuria after the stone has been treated.  He is agreeable this plan. - Urinalysis, Complete  2. Adrenal nodule (HCC) Incidental 1 cm left adrenal nodule likely adenoma  We discussed that we will likely need follow-up imaging, probably CT abdomen adrenal protocol in about 6 months to assess for stability.  We may also consider metabolic evaluation especially in the setting of severe hypertension is not able to be controlled by medication as he may have metabolically active adenoma.  Attention and follow-up to these issues.  3. Left ureteral calculus 8 mm left obstructing ureteral calculus  We discussed various treatment options for urolithiasis including observation with or without medical expulsive therapy, shockwave lithotripsy (SWL), ureteroscopy and laser lithotripsy with stent placement, and percutaneous nephrolithotomy.   We discussed that management is based on stone size, location, density, patient co-morbidities, and patient preference.    Stones <60mm in size have a >80% spontaneous passage rate. Data surrounding the use of tamsulosin for medical expulsive therapy is controversial, but meta analyses suggests it is most efficacious for distal stones between 5-71mm in size. Possible side effects include dizziness/lightheadedness, and retrograde ejaculation.   SWL has a lower stone free rate in a single procedure, but also a lower complication rate compared to ureteroscopy and  avoids a stent and associated stent related symptoms. Possible complications include renal hematoma, steinstrasse, and need for additional treatment. We discussed the role of his increased skin to stone distance can lead to decreased efficacy with shockwave lithotripsy.   Ureteroscopy with laser lithotripsy and stent placement has a higher stone free rate than SWL in a single procedure, however increased complication rate including possible infection, ureteral injury, bleeding, and stent related morbidity. Common stent related symptoms include dysuria, urgency/frequency, and flank pain.   After an extensive discussion of the risks and benefits of the above treatment options, the patient would like to proceed with ESWL.      Ronald Scotland, MD  [facility] [address] [place], [place] [address] [phone]  I spent 40 total minutes on the day of the encounter including pre-visit review of the medical record, face-to-face time with the patient, and post visit ordering of labs/imaging/tests.  The majority of the time spent time loading and reviewing images as well as face-to-face time with both the patient and his wife today.

## 2020-11-23 NOTE — H&P (View-Only) (Signed)
11/23/2020 11:25 AM   Ronald Sweeney Apr 11, 1966 [id number]  Referring provider: Smitty Cords, DO [address] [place],  [place] [address]  Chief Complaint  Patient presents with  . Cysto    HPI: 55 year old male with episodic painless gross hematuria who presents today for follow-up.  Since last visit, he underwent CT urogram which indicates an incompletely characterized 1 cm small left adrenal nodule as well as moderate left hydroureteronephrosis with a result of a 0.8 cm proximal left ureteral calculus.  Is also a punctate left kidney stone.  He has an incompletely characterized subcentimeter hypodensity in the left kidney, probable cyst.  No other pathology other than the aforementioned noted.  Stone to skin distance 12 cm, 840 Hounsfield units.  He reports that he has had no further episodes of gross hematuria since last visit.  He does have some low back pain especially when he changes position which is dull aching nonspecific and across the entirety of his low back.  No overt left flank pain.  No history of kidney stones prior to this.  He was seen and evaluated by his primary care physician, started back on amlodipine for hypertension.   PMH: Past Medical History:  Diagnosis Date  . Allergy    seasonal  . Hypertension     Surgical History: Past Surgical History:  Procedure Laterality Date  . COLONOSCOPY WITH PROPOFOL N/A 11/29/2016   Procedure: COLONOSCOPY WITH PROPOFOL;  Surgeon: Midge Minium, MD;  Location: [facility];  Service: Endoscopy;  Laterality: N/A;  . NO PAST SURGERIES      Home Medications:  Allergies as of 11/23/2020      Reactions   Shellfish Allergy Shortness Of Breath, Swelling      Medication List       Accurate as of Nov 23, 2020 11:25 AM. If you have any questions, ask your nurse or doctor.        amLODipine 10 MG tablet Commonly known as: NORVASC Take 1 tablet (10 mg total) by mouth daily.   aspirin 81 MG  tablet Take 81 mg by mouth daily.       Allergies:  Allergies  Allergen Reactions  . Shellfish Allergy Shortness Of Breath and Swelling    Family History: Family History  Problem Relation Age of Onset  . Cancer Mother        colon cancer  . Colon polyps Mother     Social History:  reports that he has never smoked. He has never used smokeless tobacco. He reports current alcohol use. He reports that he does not use drugs.   Physical Exam: BP (!) 194/91   Pulse 91   Ht 5' 8.5" (1.74 m)   Wt 191 lb (86.6 kg)   BMI 28.62 kg/m   Constitutional:  Alert and oriented, No acute distress.  Accompanied by his wife today. HEENT: [place] AT, moist mucus membranes.  Trachea midline, no masses. Cardiovascular: No clubbing, cyanosis, or edema. Respiratory: Normal respiratory effort, no increased work of breathing. Skin: No rashes, bruises or suspicious lesions. Neurologic: Grossly intact, no focal deficits, moving all 4 extremities. Psychiatric: Normal mood and affect.  Laboratory Data: Lab Results  Component Value Date   WBC 4.6 10/12/2016   HGB 15.1 10/12/2016   HCT 45.4 10/12/2016   MCV 83.2 10/12/2016   PLT 166 10/12/2016    Lab Results  Component Value Date   CREATININE 1.23 10/21/2017    Lab Results  Component Value Date  PSA 1.49 01/10/2016    Lab Results  Component Value Date   HGBA1C 5.5 10/12/2016   Pertinent Imaging: Performed by [id number] IMPRESSION:  1. Positive for moderate left obstructive uropathy as a result of a 0.8 cm proximal left ureteral calculus. Additional small calculus is present within the left kidney. Probable bilateral renal cysts and an incompletely characterized small cortically-based 0.4 cm hypodensity involving the posterior left kidney.   2. Diverticulosis without focal diverticulitis.   3. Incompletely characterized 1 cm small left adrenal nodule. Recommend attention on follow-up imaging.   4. Additional findings as detailed above.    Electronically Signed by: Leane Para on 11/21/2020 4:14 PM   The above CT scan was without today on disc.  I personally loaded the images onto my computer and reviewed the images.  Agree with radiologic interpretation.  Assessment & Plan:    1. Gross hematuria Likely secondary to acute stone episode  No further episodes of gross hematuria.  As such, we will hold off on cystoscopy today but consider completing work-up down the road if he has another recurrent episode of gross hematuria or microscopic hematuria after the stone has been treated.  He is agreeable this plan. - Urinalysis, Complete  2. Adrenal nodule (HCC) Incidental 1 cm left adrenal nodule likely adenoma  We discussed that we will likely need follow-up imaging, probably CT abdomen adrenal protocol in about 6 months to assess for stability.  We may also consider metabolic evaluation especially in the setting of severe hypertension is not able to be controlled by medication as he may have metabolically active adenoma.  Attention and follow-up to these issues.  3. Left ureteral calculus 8 mm left obstructing ureteral calculus  We discussed various treatment options for urolithiasis including observation with or without medical expulsive therapy, shockwave lithotripsy (SWL), ureteroscopy and laser lithotripsy with stent placement, and percutaneous nephrolithotomy.   We discussed that management is based on stone size, location, density, patient co-morbidities, and patient preference.    Stones <60mm in size have a >80% spontaneous passage rate. Data surrounding the use of tamsulosin for medical expulsive therapy is controversial, but meta analyses suggests it is most efficacious for distal stones between 5-71mm in size. Possible side effects include dizziness/lightheadedness, and retrograde ejaculation.   SWL has a lower stone free rate in a single procedure, but also a lower complication rate compared to ureteroscopy and  avoids a stent and associated stent related symptoms. Possible complications include renal hematoma, steinstrasse, and need for additional treatment. We discussed the role of his increased skin to stone distance can lead to decreased efficacy with shockwave lithotripsy.   Ureteroscopy with laser lithotripsy and stent placement has a higher stone free rate than SWL in a single procedure, however increased complication rate including possible infection, ureteral injury, bleeding, and stent related morbidity. Common stent related symptoms include dysuria, urgency/frequency, and flank pain.   After an extensive discussion of the risks and benefits of the above treatment options, the patient would like to proceed with ESWL.      Vanna Scotland, MD  [facility] [address] [place], [place] [address] [phone]  I spent 40 total minutes on the day of the encounter including pre-visit review of the medical record, face-to-face time with the patient, and post visit ordering of labs/imaging/tests.  The majority of the time spent time loading and reviewing images as well as face-to-face time with both the patient and his wife today.

## 2020-11-23 NOTE — Patient Instructions (Signed)
[facility] (25th ed., pp. 811-816). [place], [place]: [employer], Else[employer]. Retrieved from https://www.clinicalkey.com/#!/content/book/3-[email or web address]-[email or web address]?scrollTo=%[email or web address]">  Lithotripsy  Lithotripsy is a treatment that can help break up kidney stones that are too large to pass on their own. This is a nonsurgical procedure that crushes a kidney stone with shock waves. These shock waves pass through your body and focus on the kidney stone. They cause the kidney stone to break up into smaller pieces while it is still in the urinary tract. The smaller pieces of stone can pass more easily out of your body in the urine. Tell a health care provider about:  Any allergies you have.  All medicines you are taking, including vitamins, herbs, eye drops, creams, and over-the-counter medicines.  Any problems you or family members have had with anesthetic medicines.  Any blood disorders you have.  Any surgeries you have had.  Any medical conditions you have.  Whether you are pregnant or may be pregnant. What are the risks? Generally, this is a safe procedure. However, problems may occur, including:  Infection.  Bleeding from the kidney.  Bruising of the kidney or skin.  Scarring of the kidney, which can lead to: ? Increased blood pressure. ? Poor kidney function. ? Return (recurrence) of kidney stones.  Damage to other structures or organs, such as the liver, colon, spleen, or pancreas.  Blockage (obstruction) of the tube that carries urine from the kidney to the bladder (ureter).  Failure of the kidney stone to break into pieces (fragments). What happens before the procedure? Staying hydrated Follow instructions from your health care provider about hydration, which may include:  Up to 2 hours before the procedure - you may continue to drink clear liquids, such as water, clear fruit juice, black coffee, and plain tea. Eating and drinking restrictions Follow  instructions from your health care provider about eating and drinking, which may include:  8 hours before the procedure - stop eating heavy meals or foods, such as meat, fried foods, or fatty foods.  6 hours before the procedure - stop eating light meals or foods, such as toast or cereal.  6 hours before the procedure - stop drinking milk or drinks that contain milk.  2 hours before the procedure - stop drinking clear liquids. Medicines Ask your health care provider about:  Changing or stopping your regular medicines. This is especially important if you are taking diabetes medicines or blood thinners.  Taking medicines such as aspirin and ibuprofen. These medicines can thin your blood. Do not take these medicines unless your health care provider tells you to take them.  Taking over-the-counter medicines, vitamins, herbs, and supplements. Tests You may have tests, such as:  Blood tests.  Urine tests.  Imaging tests, such as a CT scan. General instructions  Plan to have someone take you home from the hospital or clinic.  If you will be going home right after the procedure, plan to have someone with you for 24 hours.  Ask your health care provider what steps will be taken to help prevent infection. These may include washing skin with a germ-killing soap. What happens during the procedure?  An IV will be inserted into one of your veins.  You will be given one or more of the following: ? A medicine to help you relax (sedative). ? A medicine to make you fall asleep (general anesthetic).  A water-filled cushion may be placed behind your kidney or on your abdomen. In some cases, you may be placed in a tub of   lukewarm water.  Your body will be positioned in a way that makes it easy to target the kidney stone.  An X-ray or ultrasound exam will be done to locate your stone.  Shock waves will be aimed at the stone. If you are awake, you may feel a tapping sensation as the shock  waves pass through your body.  A flexible tube with holes in it (stent) may be placed in the ureter. This will help keep urine flowing from the kidney if the fragments of the stone have been blocking the ureter. The procedure may vary among health care providers and hospitals.   What happens after the procedure?  You may have an X-ray to see whether the procedure was able to break up the kidney stone and how much of the stone has passed. If large stone fragments remain after treatment, you may need to have a second procedure at a later time.  Your blood pressure, heart rate, breathing rate, and blood oxygen level will be monitored until you leave the hospital or clinic.  You may be given antibiotics or pain medicine as needed.  If a stent was placed in your ureter during surgery, it may stay in place for a few weeks.  You may need to strain your urine to collect pieces of the kidney stone for testing.  You will need to drink plenty of water.  If you were given a sedative during the procedure, it can affect you for several hours. Do not drive or operate machinery until your health care provider says that it is safe. Summary  Lithotripsy is a treatment that can help break up kidney stones that are too large to pass on their own.  Lithotripsy is a nonsurgical procedure that crushes a kidney stone with shock waves.  Generally, this is a safe procedure. However, problems may occur, including damage to the kidney or other organs, infection, or obstruction of the tube that carries urine from the kidney to the bladder (ureter).  You may have a stent placed in your ureter to help drain your urine. This stent may stay in place for a few weeks.  After the procedure, you will need to drink plenty of water. You may be asked to strain your urine to collect pieces of the kidney stone for testing. This information is not intended to replace advice given to you by your health care provider. Make sure  you discuss any questions you have with your health care provider. Document Revised: 04/01/2019 Document Reviewed: 04/01/2019 Elsevier Patient Education  2021 Elsevier Inc.  

## 2020-11-24 LAB — URINALYSIS, COMPLETE
Bilirubin, UA: NEGATIVE
Glucose, UA: NEGATIVE
Ketones, UA: NEGATIVE
Leukocytes,UA: NEGATIVE
Nitrite, UA: NEGATIVE
Protein,UA: NEGATIVE
RBC, UA: NEGATIVE
Specific Gravity, UA: 1.015 (ref 1.005–1.030)
Urobilinogen, Ur: 0.2 mg/dL (ref 0.2–1.0)
pH, UA: 6.5 (ref 5.0–7.5)

## 2020-11-24 LAB — MICROSCOPIC EXAMINATION
Bacteria, UA: NONE SEEN
Epithelial Cells (non renal): NONE SEEN /hpf (ref 0–10)

## 2020-11-25 ENCOUNTER — Other Ambulatory Visit: Payer: Self-pay | Admitting: Urology

## 2020-11-25 DIAGNOSIS — N201 Calculus of ureter: Secondary | ICD-10-CM

## 2020-11-26 LAB — CULTURE, URINE COMPREHENSIVE

## 2020-11-28 MED ORDER — CEPHALEXIN 250 MG PO CAPS: 500.0000 mg | ORAL_CAPSULE | ORAL | Status: AC

## 2020-12-01 ENCOUNTER — Ambulatory Visit: Payer: No Typology Code available for payment source

## 2020-12-01 ENCOUNTER — Ambulatory Visit
Admission: RE | Admit: 2020-12-01 | Discharge: 2020-12-01 | Disposition: A | Discharge status: Home / Self Care | Payer: No Typology Code available for payment source | Attending: Urology | Admitting: Urology

## 2020-12-01 ENCOUNTER — Encounter: Payer: Self-pay | Admitting: Urology

## 2020-12-01 ENCOUNTER — Other Ambulatory Visit: Payer: Self-pay

## 2020-12-01 ENCOUNTER — Encounter
Admission: RE | Disposition: A | Discharge status: Home / Self Care | Payer: Self-pay | Source: Home / Self Care | Attending: Urology

## 2020-12-01 DIAGNOSIS — I10 Essential (primary) hypertension: Secondary | ICD-10-CM | POA: Diagnosis not present

## 2020-12-01 DIAGNOSIS — Z91013 Allergy to seafood: Secondary | ICD-10-CM | POA: Insufficient documentation

## 2020-12-01 DIAGNOSIS — N201 Calculus of ureter: Secondary | ICD-10-CM

## 2020-12-01 DIAGNOSIS — N132 Hydronephrosis with renal and ureteral calculous obstruction: Secondary | ICD-10-CM | POA: Insufficient documentation

## 2020-12-01 HISTORY — PX: EXTRACORPOREAL SHOCK WAVE LITHOTRIPSY: SHX1557

## 2020-12-01 SURGERY — LITHOTRIPSY, ESWL
Anesthesia: Moderate Sedation | Laterality: Left

## 2020-12-01 MED ORDER — DIAZEPAM 5 MG PO TABS
ORAL_TABLET | ORAL | Status: AC
  Administered 2020-12-01: 10 mg via ORAL
  Filled 2020-12-01: qty 2

## 2020-12-01 MED ORDER — TAMSULOSIN HCL 0.4 MG PO CAPS: 0.4000 mg | ORAL_CAPSULE | Freq: Every day | ORAL | 0 refills | Status: DC

## 2020-12-01 MED ORDER — ONDANSETRON HCL 8 MG PO TABS: 8.0000 mg | ORAL_TABLET | Freq: Three times a day (TID) | ORAL | 0 refills | Status: DC | PRN

## 2020-12-01 MED ORDER — DIPHENHYDRAMINE HCL 25 MG PO CAPS
ORAL_CAPSULE | ORAL | Status: AC
  Administered 2020-12-01: 25 mg via ORAL
  Filled 2020-12-01: qty 1

## 2020-12-01 MED ORDER — DIAZEPAM 5 MG PO TABS: 10.0000 mg | ORAL_TABLET | ORAL | Status: AC

## 2020-12-01 MED ORDER — SODIUM CHLORIDE 0.9 % IV SOLN: INTRAVENOUS | Status: DC

## 2020-12-01 MED ORDER — CEPHALEXIN 500 MG PO CAPS: 500.0000 mg | ORAL_CAPSULE | Freq: Once | ORAL | Status: AC

## 2020-12-01 MED ORDER — DIAZEPAM 2 MG PO TABS
ORAL_TABLET | ORAL | Status: AC
  Filled 2020-12-01: qty 1

## 2020-12-01 MED ORDER — DIAZEPAM 5 MG PO TABS
ORAL_TABLET | ORAL | Status: AC
  Filled 2020-12-01: qty 1

## 2020-12-01 MED ORDER — OXYCODONE-ACETAMINOPHEN 5-325 MG PO TABS: 1.0000 | ORAL_TABLET | Freq: Four times a day (QID) | ORAL | 0 refills | Status: DC | PRN

## 2020-12-01 MED ORDER — ONDANSETRON HCL 4 MG/2ML IJ SOLN
INTRAMUSCULAR | Status: AC
  Administered 2020-12-01: 4 mg via INTRAVENOUS
  Filled 2020-12-01: qty 2

## 2020-12-01 MED ORDER — CEPHALEXIN 500 MG PO CAPS
ORAL_CAPSULE | ORAL | Status: AC
  Administered 2020-12-01: 500 mg via ORAL
  Filled 2020-12-01: qty 1

## 2020-12-01 MED ORDER — ONDANSETRON HCL 4 MG/2ML IJ SOLN
4.0000 mg | Freq: Once | INTRAMUSCULAR | Status: AC | PRN
Start: 2020-12-01 — End: 2020-12-01

## 2020-12-01 MED ORDER — DIPHENHYDRAMINE HCL 25 MG PO CAPS: 25.0000 mg | ORAL_CAPSULE | ORAL | Status: AC

## 2020-12-01 NOTE — Discharge Instructions (Signed)
AMBULATORY SURGERY  DISCHARGE INSTRUCTIONS   1) The drugs that you were given will stay in your system until tomorrow so for the next 24 hours you should not:  A) Drive an automobile B) Make any legal decisions C) Drink any alcoholic beverage   2) You may resume regular meals tomorrow.  Today it is better to start with liquids and gradually work up to solid foods.  You may eat anything you prefer, but it is better to start with liquids, then soup and crackers, and gradually work up to solid foods.   3) Please notify your doctor immediately if you have any unusual bleeding, trouble breathing, redness and pain at the surgery site, drainage, fever, or pain not relieved by medication.    4) Additional Instructions:  Postop instructions as per the [facility] instructions  Follow-up appointment scheduled 12/15/2020  Prescriptions for pain medication (oxycodone), Zofran (nausea), and tamsulosin (to help pass stone fragments) were sent to your pharmacy  Please contact your physician with any problems or Same Day Surgery at [phone], Monday through Friday 6 am to 4 pm, or [place] [facility] number at [phone].

## 2020-12-01 NOTE — Interval H&P Note (Signed)
History and Physical Interval Note: CV:RRR Lungs;clear  12/01/2020 7:46 AM  Ronald Sweeney  has presented today for surgery, with the diagnosis of Kidney stone.  The various methods of treatment have been discussed with the patient and family. After consideration of risks, benefits and other options for treatment, the patient has consented to  Procedure(s): EXTRACORPOREAL SHOCK WAVE LITHOTRIPSY (ESWL) (Left) as a surgical intervention.  The patient's history has been reviewed, patient examined, no change in status, stable for surgery.  I have reviewed the patient's chart and labs.  Questions were answered to the patient's satisfaction.     Cammy Sanjurjo C [name]

## 2020-12-05 ENCOUNTER — Other Ambulatory Visit: Payer: Self-pay | Admitting: *Deleted

## 2020-12-05 DIAGNOSIS — N201 Calculus of ureter: Secondary | ICD-10-CM

## 2020-12-15 ENCOUNTER — Ambulatory Visit: Payer: No Typology Code available for payment source | Admitting: Physician Assistant

## 2020-12-23 ENCOUNTER — Other Ambulatory Visit: Payer: Self-pay

## 2020-12-23 ENCOUNTER — Ambulatory Visit (INDEPENDENT_AMBULATORY_CARE_PROVIDER_SITE_OTHER): Payer: No Typology Code available for payment source | Admitting: Physician Assistant

## 2020-12-23 ENCOUNTER — Ambulatory Visit
Admission: RE | Admit: 2020-12-23 | Discharge: 2020-12-23 | Disposition: A | Discharge status: Home / Self Care | Payer: No Typology Code available for payment source | Attending: Urology | Admitting: Urology

## 2020-12-23 ENCOUNTER — Ambulatory Visit
Admission: RE | Admit: 2020-12-23 | Discharge: 2020-12-23 | Disposition: A | Discharge status: Home / Self Care | Payer: No Typology Code available for payment source | Source: Ambulatory Visit | Attending: Urology | Admitting: Urology

## 2020-12-23 ENCOUNTER — Encounter: Payer: Self-pay | Admitting: Physician Assistant

## 2020-12-23 VITALS — BP 165/93 | HR 80 | Ht 69.0 in | Wt 188.0 lb

## 2020-12-23 DIAGNOSIS — N201 Calculus of ureter: Secondary | ICD-10-CM | POA: Insufficient documentation

## 2020-12-23 LAB — URINALYSIS, COMPLETE
Bilirubin, UA: NEGATIVE
Glucose, UA: NEGATIVE
Ketones, UA: NEGATIVE
Leukocytes,UA: NEGATIVE
Nitrite, UA: NEGATIVE
Protein,UA: NEGATIVE
RBC, UA: NEGATIVE
Specific Gravity, UA: 1.015 (ref 1.005–1.030)
Urobilinogen, Ur: 0.2 mg/dL (ref 0.2–1.0)
pH, UA: 6 (ref 5.0–7.5)

## 2020-12-23 LAB — MICROSCOPIC EXAMINATION
Bacteria, UA: NONE SEEN
Epithelial Cells (non renal): NONE SEEN /hpf (ref 0–10)
RBC, Urine: NONE SEEN /hpf (ref 0–2)

## 2020-12-23 MED ORDER — TAMSULOSIN HCL 0.4 MG PO CAPS: 0.4000 mg | ORAL_CAPSULE | Freq: Every day | ORAL | 0 refills | Status: DC

## 2020-12-23 NOTE — Patient Instructions (Signed)
For the next 3 weeks, please do the following: -Take Flomax 0.4mg  daily -Stay well hydrated -Strain your urine to catch any stones that pass -Treat any pain with ibuprofen/tylenol  I will plan to see you back in clinic in 3 weeks with another x-ray prior to see if you have passed your stone.  Please call our office immediately (we are open 8a-5p Monday-Friday) or go to the Emergency Department if you develop any of the following: -Fever -Chills -Nausea and/or vomiting -Pain uncontrollable with ibuprofen/tylenol

## 2020-12-23 NOTE — Progress Notes (Signed)
12/23/2020 3:53 PM   Ronald Sweeney 10/10/1965 [id number]  CC: Chief Complaint  Patient presents with   Nephrolithiasis   HPI: Ronald Sweeney is a 55 y.o. male with PMH hypertension, incidental left adrenal nodule, and nephrolithiasis who underwent ESWL with Dr. Lonna Cobb 22 days ago for management of an 8 mm proximal left ureteral stone who presents today for postop follow-up.  Operative note with smudging of the stone. He is accompanied today by his wife, who contributes to HPI.  Today he reports having passed some sandy material and he is unsure if it represents stone fragments.  He did not capture any of it for analysis.  He states his flank pain and gross hematuria have resolved and he feels better, almost as though a switch was flipped following his procedure.  KUB today with stable 8 mm proximal left ureteral stone.  In-office UA and microscopy today pan negative.  PMH: Past Medical History:  Diagnosis Date   Allergy    seasonal   Hypertension     Surgical History: Past Surgical History:  Procedure Laterality Date   COLONOSCOPY WITH PROPOFOL N/A 11/29/2016   Procedure: COLONOSCOPY WITH PROPOFOL;  Surgeon: Midge Minium, MD;  Location: [facility];  Service: Endoscopy;  Laterality: N/A;   EXTRACORPOREAL SHOCK WAVE LITHOTRIPSY Left 12/01/2020   Procedure: EXTRACORPOREAL SHOCK WAVE LITHOTRIPSY (ESWL);  Surgeon: Riki Altes, MD;  Location: [facility];  Service: Urology;  Laterality: Left;   NO PAST SURGERIES      Home Medications:  Allergies as of 12/23/2020       Reactions   Shellfish Allergy Shortness Of Breath, Swelling        Medication List        Accurate as of December 23, 2020  3:53 PM. If you have any questions, ask your nurse or doctor.          amLODipine 10 MG tablet Commonly known as: NORVASC Take 1 tablet (10 mg total) by mouth daily.   aspirin 81 MG tablet Take 81 mg by mouth daily.   ondansetron 8 MG tablet Commonly known  as: ZOFRAN Take 1 tablet (8 mg total) by mouth every 8 (eight) hours as needed for nausea or vomiting.   oxyCODONE-acetaminophen 5-325 MG tablet Commonly known as: PERCOCET/ROXICET Take 1 tablet by mouth every 6 (six) hours as needed for severe pain.   tamsulosin 0.4 MG Caps capsule Commonly known as: FLOMAX Take 1 capsule (0.4 mg total) by mouth daily after breakfast.        Allergies:  Allergies  Allergen Reactions   Shellfish Allergy Shortness Of Breath and Swelling    Family History: Family History  Problem Relation Age of Onset   Cancer Mother        colon cancer   Colon polyps Mother     Social History:   reports that he has never smoked. He has never used smokeless tobacco. He reports current alcohol use. He reports that he does not use drugs.  Physical Exam: BP (!) 165/93   Pulse 80   Ht 5\' 9"  (1.753 m)   Wt 188 lb (85.3 kg)   BMI 27.76 kg/m   Constitutional:  Alert and oriented, no acute distress, nontoxic appearing HEENT: [place], AT Cardiovascular: No clubbing, cyanosis, or edema Respiratory: Normal respiratory effort, no increased work of breathing Skin: No rashes, bruises or suspicious lesions Neurologic: Grossly intact, no focal deficits, moving all 4 extremities Psychiatric: Normal mood and affect  Laboratory Data:  Results for orders placed or performed in visit on 12/23/20  Microscopic Examination   Urine  Result Value Ref Range   WBC, UA 0-5 0 - 5 /hpf   RBC None seen 0 - 2 /hpf   Epithelial Cells (non renal) None seen 0 - 10 /hpf   Bacteria, UA None seen None seen/Few  Urinalysis, Complete  Result Value Ref Range   Specific Gravity, UA 1.015 1.005 - 1.030   pH, UA 6.0 5.0 - 7.5   Color, UA Yellow Yellow   Appearance Ur Clear Clear   Leukocytes,UA Negative Negative   Protein,UA Negative Negative/Trace   Glucose, UA Negative Negative   Ketones, UA Negative Negative   RBC, UA Negative Negative   Bilirubin, UA Negative Negative    Urobilinogen, Ur 0.2 0.2 - 1.0 mg/dL   Nitrite, UA Negative Negative   Microscopic Examination See below:    Pertinent Imaging: KUB, 12/23/2020: CLINICAL DATA:  Two weeks status post lithotripsy 4 left-sided renal stones.   EXAM: ABDOMEN - 1 VIEW   COMPARISON:  Abdominal radiograph dated 12/01/2020.   FINDINGS: A 9 mm stone overlying the left ureter is unchanged in position compared to prior exam. Phleboliths are noted in the pelvis, unchanged. Stool is seen in the rectum. Mild degenerative changes are seen in both hips and in the spine.   IMPRESSION: Stone overlying the left ureter is unchanged in position and measures 9 mm.     Electronically Signed   By: Romona Curls M.D.   On: 12/25/2020 15:33  I personally reviewed the images referenced above and note a stable left ureteral stone.  Assessment & Plan:   1. Left ureteral calculus Flank pain and gross hematuria have resolved, UA is clear, however there is persistence of his left ureteral stone on KUB.  I explained to the patient that it may take up to 4 to 6 weeks to pass fragments following ESWL.  I attempted to reassure him and his wife that he may simply need more time to pass his stone and that persistent stone/fragments at 3 weeks postprocedure is not uncommon and does not necessarily indicate a treatment failure.  I recommended resuming Flomax, pushing fluids, and planning for symptom recheck with UA and KUB in an additional 3 weeks.  I explained that if his stone has not passed by that point, we may discuss a repeat procedure, either repeat ESWL versus ureteroscopy.  Despite this, patient expressed frustration with ESWL and stated that if he had known that "this was going to go on so long" he would not have chosen it.  He states that he does not understand why he was informed that he would be a good candidate for ESWL when he has not yet passed his fragments.  I explained that he is in fact a good candidate for ESWL,  that despite this there is always a risk for treatment failure, and again that it takes several weeks to pass fragments after a shockwave lithotripsy and that his persistent stone/fragments at 3 weeks does not yet indicate treatment failure.  We reviewed return precautions today including fever, chills, nausea, vomiting, and uncontrollable pain.  He expressed understanding.  - Urinalysis, Complete - tamsulosin (FLOMAX) 0.4 MG CAPS capsule; Take 1 capsule (0.4 mg total) by mouth daily after breakfast.  Dispense: 21 capsule; Refill: 0   Return in about 3 weeks (around 01/13/2021) for Stone f/u with UA + KUB prior.  Carman Ching, PA-C  [facility] [address]  [address], [place], [place] [address] [phone]

## 2021-01-13 ENCOUNTER — Ambulatory Visit (INDEPENDENT_AMBULATORY_CARE_PROVIDER_SITE_OTHER): Payer: No Typology Code available for payment source | Admitting: Physician Assistant

## 2021-01-13 ENCOUNTER — Other Ambulatory Visit: Payer: Self-pay | Admitting: Physician Assistant

## 2021-01-13 ENCOUNTER — Encounter: Payer: Self-pay | Admitting: Physician Assistant

## 2021-01-13 ENCOUNTER — Ambulatory Visit
Admission: RE | Admit: 2021-01-13 | Discharge: 2021-01-13 | Disposition: A | Discharge status: Home / Self Care | Payer: No Typology Code available for payment source | Source: Ambulatory Visit | Attending: Physician Assistant | Admitting: Physician Assistant

## 2021-01-13 ENCOUNTER — Other Ambulatory Visit: Payer: Self-pay

## 2021-01-13 ENCOUNTER — Ambulatory Visit
Admission: RE | Admit: 2021-01-13 | Discharge: 2021-01-13 | Disposition: A | Discharge status: Home / Self Care | Payer: No Typology Code available for payment source | Attending: Physician Assistant | Admitting: Physician Assistant

## 2021-01-13 VITALS — BP 135/81 | HR 80 | Ht 69.0 in | Wt 191.0 lb

## 2021-01-13 DIAGNOSIS — N201 Calculus of ureter: Secondary | ICD-10-CM

## 2021-01-13 LAB — URINALYSIS, COMPLETE
Bilirubin, UA: NEGATIVE
Glucose, UA: NEGATIVE
Ketones, UA: NEGATIVE
Nitrite, UA: NEGATIVE
Protein,UA: NEGATIVE
Specific Gravity, UA: 1.02 (ref 1.005–1.030)
Urobilinogen, Ur: 0.2 mg/dL (ref 0.2–1.0)
pH, UA: 5.5 (ref 5.0–7.5)

## 2021-01-13 LAB — MICROSCOPIC EXAMINATION: Bacteria, UA: NONE SEEN

## 2021-01-13 NOTE — H&P (View-Only) (Signed)
01/13/2021 2:47 PM   Ronald Sweeney [birth year] [id number]  CC: Chief Complaint  Patient presents with   Nephrolithiasis   HPI: Ronald Sweeney is a 55 y.o. male with PMH hypertension, incidental left adrenal nodule, and nephrolithiasis who underwent ESWL with Dr. Lonna Cobb on 12/01/2020 for management of an 8 mm proximal left ureteral stone who presents today for 6-week postop follow-up.  I saw him in clinic 3 weeks ago, at which point he reported resolution of gross hematuria but KUB revealed stable left ureteral stone.  He is accompanied today again by his wife, who contributes to HPI.  Today he reports he continues to feel well with no significant left flank pain or gross hematuria.  KUB today again with a stable 8 mm proximal left ureteral stone.  In-office UA today positive for trace intact blood and trace leukocyte esterase; urine microscopy with 6-10 WBCs/HPF.  PMH: Past Medical History:  Diagnosis Date   Allergy    seasonal   Hypertension     Surgical History: Past Surgical History:  Procedure Laterality Date   COLONOSCOPY WITH PROPOFOL N/A 11/29/2016   Procedure: COLONOSCOPY WITH PROPOFOL;  Surgeon: Midge Minium, MD;  Location: [facility];  Service: Endoscopy;  Laterality: N/A;   EXTRACORPOREAL SHOCK WAVE LITHOTRIPSY Left 12/01/2020   Procedure: EXTRACORPOREAL SHOCK WAVE LITHOTRIPSY (ESWL);  Surgeon: Riki Altes, MD;  Location: [facility];  Service: Urology;  Laterality: Left;   NO PAST SURGERIES      Home Medications:  Allergies as of 01/13/2021       Reactions   Shellfish Allergy Shortness Of Breath, Swelling        Medication List        Accurate as of January 13, 2021  2:47 PM. If you have any questions, ask your nurse or doctor.          amLODipine 10 MG tablet Commonly known as: NORVASC Take 1 tablet (10 mg total) by mouth daily.   aspirin 81 MG tablet Take 81 mg by mouth daily.   ondansetron 8 MG tablet Commonly known as:  ZOFRAN Take 1 tablet (8 mg total) by mouth every 8 (eight) hours as needed for nausea or vomiting.   oxyCODONE-acetaminophen 5-325 MG tablet Commonly known as: PERCOCET/ROXICET Take 1 tablet by mouth every 6 (six) hours as needed for severe pain.   tamsulosin 0.4 MG Caps capsule Commonly known as: FLOMAX Take 1 capsule (0.4 mg total) by mouth daily after breakfast.        Allergies:  Allergies  Allergen Reactions   Shellfish Allergy Shortness Of Breath and Swelling    Family History: Family History  Problem Relation Age of Onset   Cancer Mother        colon cancer   Colon polyps Mother     Social History:   reports that he has never smoked. He has never used smokeless tobacco. He reports current alcohol use. He reports that he does not use drugs.  Physical Exam: BP 135/81   Pulse 80   Ht 5\' 9"  (1.753 m)   Wt 191 lb (86.6 kg)   BMI 28.21 kg/m   Constitutional:  Alert and oriented, no acute distress, nontoxic appearing HEENT: [place], AT Cardiovascular: No clubbing, cyanosis, or edema Respiratory: Normal respiratory effort, no increased work of breathing Skin: No rashes, bruises or suspicious lesions Neurologic: Grossly intact, no focal deficits, moving all 4 extremities Psychiatric: Normal mood and affect  Laboratory Data: Results for orders placed or  performed in visit on 01/13/21  Microscopic Examination   Urine  Result Value Ref Range   WBC, UA 6-10 (A) 0 - 5 /hpf   RBC 0-2 0 - 2 /hpf   Epithelial Cells (non renal) 0-10 0 - 10 /hpf   Mucus, UA Present (A) Not Estab.   Bacteria, UA None seen None seen/Few  Urinalysis, Complete  Result Value Ref Range   Specific Gravity, UA 1.020 1.005 - 1.030   pH, UA 5.5 5.0 - 7.5   Color, UA Yellow Yellow   Appearance Ur Clear Clear   Leukocytes,UA Trace (A) Negative   Protein,UA Negative Negative/Trace   Glucose, UA Negative Negative   Ketones, UA Negative Negative   RBC, UA Trace (A) Negative   Bilirubin, UA  Negative Negative   Urobilinogen, Ur 0.2 0.2 - 1.0 mg/dL   Nitrite, UA Negative Negative   Microscopic Examination See below:    Pertinent Imaging: KUB, 01/13/2021: CLINICAL DATA:  Left ureteral stone. Continued left-sided flank pain.   EXAM: ABDOMEN - 1 VIEW   COMPARISON:  December 23, 2020   FINDINGS: The 9 mm stone in the left ureter overlying the left L3 transverse process is unchanged. Calcifications in the pelvis, 2 on the left and 1 on the right are unchanged. No other ureteral stones identified. No renal stones noted. Both kidneys are partially obscured by bowel contents. No other acute abnormalities.   IMPRESSION: 1. The stone overlying the left ureter is unchanged in position and continues to measure 9 mm. No other changes.     Electronically Signed   By: Gerome Sam III M.D   On: 01/16/2021 12:15  I personally reviewed the images referenced above and note a stable 8 mm proximal left ureteral stone.  Assessment & Plan:   1. Left ureteral calculus Patient remains minimally symptomatic, however stone persists on KUB.  We discussed that at this point, I think it is unlikely that he will spontaneously pass the stone and that we should consider repeat stone treatment.  We discussed various treatment options for his stone including repeat ESWL vs. ureteroscopy with laser lithotripsy and stent placement. We discussed the risks and benefits of each including efficacy with need for possible further intervention and need for temporary ureteral stent.  We discussed the risk for stent discomfort as well.  Patient is frustrated that his stone has not cleared and I again reiterated that this is a rare but not impossible outcome of ESWL.  Patient wishes to consider his options and contact clinic with how to proceed.  I offered him a BMP today to check his renal function, which he declined.  I encouraged him to follow-up with [place] as planned and explained that the long-term risks of  chronic ureteral stone include ureteral obstruction and renal damage over time.  He expressed understanding.  We discussed return precautions today including fever, chills, nausea, vomiting, and uncontrollable pain. - Urinalysis, Complete  Return for contact our office when you decide how you would like to proceed.  Carman Ching, PA-C  [facility] [address] [place], [place] [address] [phone]

## 2021-01-13 NOTE — Progress Notes (Signed)
01/13/2021 2:47 PM   Ronald Sweeney [birth year] [id number]  CC: Chief Complaint  Patient presents with   Nephrolithiasis   HPI: Ronald Sweeney is a 55 y.o. male with PMH hypertension, incidental left adrenal nodule, and nephrolithiasis who underwent ESWL with Dr. Lonna Cobb on 12/01/2020 for management of an 8 mm proximal left ureteral stone who presents today for 6-week postop follow-up.  I saw him in clinic 3 weeks ago, at which point he reported resolution of gross hematuria but KUB revealed stable left ureteral stone.  He is accompanied today again by his wife, who contributes to HPI.  Today he reports he continues to feel well with no significant left flank pain or gross hematuria.  KUB today again with a stable 8 mm proximal left ureteral stone.  In-office UA today positive for trace intact blood and trace leukocyte esterase; urine microscopy with 6-10 WBCs/HPF.  PMH: Past Medical History:  Diagnosis Date   Allergy    seasonal   Hypertension     Surgical History: Past Surgical History:  Procedure Laterality Date   COLONOSCOPY WITH PROPOFOL N/A 11/29/2016   Procedure: COLONOSCOPY WITH PROPOFOL;  Surgeon: Midge Minium, MD;  Location: [facility];  Service: Endoscopy;  Laterality: N/A;   EXTRACORPOREAL SHOCK WAVE LITHOTRIPSY Left 12/01/2020   Procedure: EXTRACORPOREAL SHOCK WAVE LITHOTRIPSY (ESWL);  Surgeon: Riki Altes, MD;  Location: [facility];  Service: Urology;  Laterality: Left;   NO PAST SURGERIES      Home Medications:  Allergies as of 01/13/2021       Reactions   Shellfish Allergy Shortness Of Breath, Swelling        Medication List        Accurate as of January 13, 2021  2:47 PM. If you have any questions, ask your nurse or doctor.          amLODipine 10 MG tablet Commonly known as: NORVASC Take 1 tablet (10 mg total) by mouth daily.   aspirin 81 MG tablet Take 81 mg by mouth daily.   ondansetron 8 MG tablet Commonly known as:  ZOFRAN Take 1 tablet (8 mg total) by mouth every 8 (eight) hours as needed for nausea or vomiting.   oxyCODONE-acetaminophen 5-325 MG tablet Commonly known as: PERCOCET/ROXICET Take 1 tablet by mouth every 6 (six) hours as needed for severe pain.   tamsulosin 0.4 MG Caps capsule Commonly known as: FLOMAX Take 1 capsule (0.4 mg total) by mouth daily after breakfast.        Allergies:  Allergies  Allergen Reactions   Shellfish Allergy Shortness Of Breath and Swelling    Family History: Family History  Problem Relation Age of Onset   Cancer Mother        colon cancer   Colon polyps Mother     Social History:   reports that he has never smoked. He has never used smokeless tobacco. He reports current alcohol use. He reports that he does not use drugs.  Physical Exam: BP 135/81   Pulse 80   Ht 5\' 9"  (1.753 m)   Wt 191 lb (86.6 kg)   BMI 28.21 kg/m   Constitutional:  Alert and oriented, no acute distress, nontoxic appearing HEENT: [place], AT Cardiovascular: No clubbing, cyanosis, or edema Respiratory: Normal respiratory effort, no increased work of breathing Skin: No rashes, bruises or suspicious lesions Neurologic: Grossly intact, no focal deficits, moving all 4 extremities Psychiatric: Normal mood and affect  Laboratory Data: Results for orders placed or  performed in visit on 01/13/21  Microscopic Examination   Urine  Result Value Ref Range   WBC, UA 6-10 (A) 0 - 5 /hpf   RBC 0-2 0 - 2 /hpf   Epithelial Cells (non renal) 0-10 0 - 10 /hpf   Mucus, UA Present (A) Not Estab.   Bacteria, UA None seen None seen/Few  Urinalysis, Complete  Result Value Ref Range   Specific Gravity, UA 1.020 1.005 - 1.030   pH, UA 5.5 5.0 - 7.5   Color, UA Yellow Yellow   Appearance Ur Clear Clear   Leukocytes,UA Trace (A) Negative   Protein,UA Negative Negative/Trace   Glucose, UA Negative Negative   Ketones, UA Negative Negative   RBC, UA Trace (A) Negative   Bilirubin, UA  Negative Negative   Urobilinogen, Ur 0.2 0.2 - 1.0 mg/dL   Nitrite, UA Negative Negative   Microscopic Examination See below:    Pertinent Imaging: KUB, 01/13/2021: CLINICAL DATA:  Left ureteral stone. Continued left-sided flank pain.   EXAM: ABDOMEN - 1 VIEW   COMPARISON:  December 23, 2020   FINDINGS: The 9 mm stone in the left ureter overlying the left L3 transverse process is unchanged. Calcifications in the pelvis, 2 on the left and 1 on the right are unchanged. No other ureteral stones identified. No renal stones noted. Both kidneys are partially obscured by bowel contents. No other acute abnormalities.   IMPRESSION: 1. The stone overlying the left ureter is unchanged in position and continues to measure 9 mm. No other changes.     Electronically Signed   By: David  Williams III M.D   On: 01/16/2021 12:15  I personally reviewed the images referenced above and note a stable 8 mm proximal left ureteral stone.  Assessment & Plan:   1. Left ureteral calculus Patient remains minimally symptomatic, however stone persists on KUB.  We discussed that at this point, I think it is unlikely that he will spontaneously pass the stone and that we should consider repeat stone treatment.  We discussed various treatment options for his stone including repeat ESWL vs. ureteroscopy with laser lithotripsy and stent placement. We discussed the risks and benefits of each including efficacy with need for possible further intervention and need for temporary ureteral stent.  We discussed the risk for stent discomfort as well.  Patient is frustrated that his stone has not cleared and I again reiterated that this is a rare but not impossible outcome of ESWL.  Patient wishes to consider his options and contact clinic with how to proceed.  I offered him a BMP today to check his renal function, which he declined.  I encouraged him to follow-up with us as planned and explained that the long-term risks of  chronic ureteral stone include ureteral obstruction and renal damage over time.  He expressed understanding.  We discussed return precautions today including fever, chills, nausea, vomiting, and uncontrollable pain. - Urinalysis, Complete  Return for contact our office when you decide how you would like to proceed.  Kellan Raffield, PA-C  [facility] [address], [address] [place], [place] [address] [phone]    

## 2021-01-27 ENCOUNTER — Other Ambulatory Visit: Payer: No Typology Code available for payment source

## 2021-01-27 ENCOUNTER — Other Ambulatory Visit: Payer: Self-pay

## 2021-01-27 DIAGNOSIS — Z01818 Encounter for other preprocedural examination: Secondary | ICD-10-CM

## 2021-01-27 DIAGNOSIS — N201 Calculus of ureter: Secondary | ICD-10-CM

## 2021-01-30 LAB — CULTURE, URINE COMPREHENSIVE

## 2021-02-01 ENCOUNTER — Inpatient Hospital Stay
Admission: RE | Admit: 2021-02-01 | Discharge: 2021-02-01 | Disposition: A | Discharge status: Home / Self Care | Payer: No Typology Code available for payment source | Source: Ambulatory Visit

## 2021-02-01 ENCOUNTER — Other Ambulatory Visit: Payer: Self-pay

## 2021-02-01 DIAGNOSIS — N201 Calculus of ureter: Secondary | ICD-10-CM

## 2021-02-01 NOTE — Patient Instructions (Addendum)
Your procedure is scheduled on: 02/06/21  Report to the Registration Desk on the 1st floor of the [facility]. To find out your arrival time, please call [phone] between 1PM - 3PM on: 02/03/21  Report to [facility] on 02/03/21 for Labs and EKG at   REMEMBER: Instructions that are not followed completely may result in serious medical risk, up to and including death; or upon the discretion of your surgeon and anesthesiologist your surgery may need to be rescheduled.  Do not eat food or drink any fluids after midnight the night before surgery.  No gum chewing, lozengers or hard candies.  TAKE THESE MEDICATIONS THE MORNING OF SURGERY WITH A SIP OF WATER:  - amLODipine (NORVASC) 10 MG tablet  One week prior to surgery: Stop Anti-inflammatories (NSAIDS) such as Advil, Aleve, Ibuprofen, Motrin, Naproxen, Naprosyn and Aspirin based products such as Excedrin, Goodys Powder, BC Powder.  Stop ANY OVER THE COUNTER supplements until after surgery.  You may however, continue to take Tylenol if needed for pain up until the day of surgery.  No Alcohol for 24 hours before or after surgery.  No Smoking including e-cigarettes for 24 hours prior to surgery.  No chewable tobacco products for at least 6 hours prior to surgery.  No nicotine patches on the day of surgery.  Do not use any "recreational" drugs for at least a week prior to your surgery.  Please be advised that the combination of cocaine and anesthesia may have negative outcomes, up to and including death. If you test positive for cocaine, your surgery will be cancelled.  On the morning of surgery brush your teeth with toothpaste and water, you may rinse your mouth with mouthwash if you wish. Do not swallow any toothpaste or mouthwash.  Do not wear jewelry, make-up, hairpins, clips or nail polish.  Do not wear lotions, powders, or perfumes.   Do not shave body from the neck down 48 hours prior to surgery just in case you cut  yourself which could leave a site for infection.  Also, freshly shaved skin may become irritated if using the CHG soap.  Contact lenses, hearing aids and dentures may not be worn into surgery.  Do not bring valuables to the hospital. [facility] is not responsible for any missing/lost belongings or valuables.   Notify your doctor if there is any change in your medical condition (cold, fever, infection).  Wear comfortable clothing (specific to your surgery type) to the hospital.  After surgery, you can help prevent lung complications by doing breathing exercises.  Take deep breaths and cough every 1-2 hours. Your doctor may order a device called an Incentive Spirometer to help you take deep breaths. When coughing or sneezing, hold a pillow firmly against your incision with both hands. This is called "splinting." Doing this helps protect your incision. It also decreases belly discomfort.  If you are being admitted to the hospital overnight, leave your suitcase in the car. After surgery it may be brought to your room.  If you are being discharged the day of surgery, you will not be allowed to drive home. You will need a responsible adult (18 years or older) to drive you home and stay with you that night.   If you are taking public transportation, you will need to have a responsible adult (18 years or older) with you. Please confirm with your physician that it is acceptable to use public transportation.   Please call the Pre-admissions Testing Dept. at [phone]  [phone] if you have any questions about these instructions.  Surgery Visitation Policy:  Patients undergoing a surgery or procedure may have one family member or support person with them as long as that person is not COVID-19 positive or experiencing its symptoms.  That person may remain in the waiting area during the procedure.  Inpatient Visitation:    Visiting hours are 7 a.m. to 8 p.m. Inpatients will be allowed two visitors  daily. The visitors may change each day during the patient's stay. No visitors under the age of 58. Any visitor under the age of 3 must be accompanied by an adult. The visitor must pass COVID-19 screenings, use hand sanitizer when entering and exiting the patient's room and wear a mask at all times, including in the patient's room. Patients must also wear a mask when staff or their visitor are in the room. Masking is required regardless of vaccination status.

## 2021-02-02 ENCOUNTER — Other Ambulatory Visit: Payer: Self-pay

## 2021-02-02 ENCOUNTER — Other Ambulatory Visit
Admission: RE | Admit: 2021-02-02 | Discharge: 2021-02-02 | Disposition: A | Discharge status: Home / Self Care | Payer: No Typology Code available for payment source | Source: Ambulatory Visit | Attending: Urology | Admitting: Urology

## 2021-02-02 HISTORY — DX: Personal history of urinary calculi: Z87.442

## 2021-02-02 NOTE — Patient Instructions (Addendum)
Your procedure is scheduled on: 02/06/21  Report to the Registration Desk on the 1st floor of the [facility]. To find out your arrival time, please call [phone] between 1PM - 3PM on: 02/03/21  Report to [facility] for Labs/EKG on 02/03/21 at 1130.  REMEMBER: Instructions that are not followed completely may result in serious medical risk, up to and including death; or upon the discretion of your surgeon and anesthesiologist your surgery may need to be rescheduled.  Do not eat food or drink any fluids after midnight the night before surgery.  No gum chewing, lozengers or hard candies.  TAKE THESE MEDICATIONS THE MORNING OF SURGERY WITH A SIP OF WATER:  - amLODipine (NORVASC) 10 MG tablet  One week prior to surgery: Stop Anti-inflammatories (NSAIDS) such as Advil, Aleve, Ibuprofen, Motrin, Naproxen, Naprosyn and Aspirin based products such as Excedrin, Goodys Powder, BC Powder.  Stop ANY OVER THE COUNTER supplements until after surgery.  You may take Tylenol as directed if needed for pain up until the day of surgery.  No Alcohol for 24 hours before or after surgery.  No Smoking including e-cigarettes for 24 hours prior to surgery.  No chewable tobacco products for at least 6 hours prior to surgery.  No nicotine patches on the day of surgery.  Do not use any "recreational" drugs for at least a week prior to your surgery.  Please be advised that the combination of cocaine and anesthesia may have negative outcomes, up to and including death. If you test positive for cocaine, your surgery will be cancelled.  On the morning of surgery brush your teeth with toothpaste and water, you may rinse your mouth with mouthwash if you wish. Do not swallow any toothpaste or mouthwash.  Do not wear jewelry, make-up, hairpins, clips or nail polish.  Do not wear lotions, powders, or perfumes.   Do not shave body from the neck down 48 hours prior to surgery just in case you cut yourself  which could leave a site for infection.  Also, freshly shaved skin may become irritated if using the CHG soap.  Contact lenses, hearing aids and dentures may not be worn into surgery.  Do not bring valuables to the hospital. [facility] is not responsible for any missing/lost belongings or valuables.   Notify your doctor if there is any change in your medical condition (cold, fever, infection).  Wear comfortable clothing (specific to your surgery type) to the hospital.  After surgery, you can help prevent lung complications by doing breathing exercises.  Take deep breaths and cough every 1-2 hours. Your doctor may order a device called an Incentive Spirometer to help you take deep breaths. When coughing or sneezing, hold a pillow firmly against your incision with both hands. This is called "splinting." Doing this helps protect your incision. It also decreases belly discomfort.  If you are being admitted to the hospital overnight, leave your suitcase in the car. After surgery it may be brought to your room.  If you are being discharged the day of surgery, you will not be allowed to drive home. You will need a responsible adult (18 years or older) to drive you home and stay with you that night.   If you are taking public transportation, you will need to have a responsible adult (18 years or older) with you. Please confirm with your physician that it is acceptable to use public transportation.   Please call the Pre-admissions Testing Dept. at [phone] if you  have any questions about these instructions.  Surgery Visitation Policy:  Patients undergoing a surgery or procedure may have one family member or support person with them as long as that person is not COVID-19 positive or experiencing its symptoms.  That person may remain in the waiting area during the procedure.  Inpatient Visitation:    Visiting hours are 7 a.m. to 8 p.m. Inpatients will be allowed two visitors daily. The  visitors may change each day during the patient's stay. No visitors under the age of 65. Any visitor under the age of 2 must be accompanied by an adult. The visitor must pass COVID-19 screenings, use hand sanitizer when entering and exiting the patient's room and wear a mask at all times, including in the patient's room. Patients must also wear a mask when staff or their visitor are in the room. Masking is required regardless of vaccination status.

## 2021-02-03 ENCOUNTER — Encounter: Payer: Self-pay | Admitting: Urgent Care

## 2021-02-03 ENCOUNTER — Other Ambulatory Visit
Admission: RE | Admit: 2021-02-03 | Discharge: 2021-02-03 | Disposition: A | Discharge status: Home / Self Care | Payer: No Typology Code available for payment source | Source: Ambulatory Visit | Attending: Urology | Admitting: Urology

## 2021-02-03 DIAGNOSIS — Z01818 Encounter for other preprocedural examination: Secondary | ICD-10-CM | POA: Diagnosis not present

## 2021-02-03 LAB — CBC
HCT: 38.6 % — ABNORMAL LOW (ref 39.0–52.0)
Hemoglobin: 13 g/dL (ref 13.0–17.0)
MCH: 29 pg (ref 26.0–34.0)
MCHC: 33.7 g/dL (ref 30.0–36.0)
MCV: 86 fL (ref 80.0–100.0)
Platelets: 196 10*3/uL (ref 150–400)
RBC: 4.49 MIL/uL (ref 4.22–5.81)
RDW: 12.4 % (ref 11.5–15.5)
WBC: 6.5 10*3/uL (ref 4.0–10.5)
nRBC: 0 % (ref 0.0–0.2)

## 2021-02-03 LAB — BASIC METABOLIC PANEL
Anion gap: 9 (ref 5–15)
BUN: 18 mg/dL (ref 6–20)
CO2: 29 mmol/L (ref 22–32)
Calcium: 9.7 mg/dL (ref 8.9–10.3)
Chloride: 103 mmol/L (ref 98–111)
Creatinine, Ser: 1.4 mg/dL — ABNORMAL HIGH (ref 0.61–1.24)
GFR, Estimated: 60 mL/min — ABNORMAL LOW (ref >60)
Glucose, Bld: 111 mg/dL — ABNORMAL HIGH (ref 70–99)
Potassium: 3.7 mmol/L (ref 3.5–5.1)
Sodium: 141 mmol/L (ref 135–145)

## 2021-02-05 MED ORDER — ORAL CARE MOUTH RINSE: 15.0000 mL | Freq: Once | OROMUCOSAL | Status: AC

## 2021-02-05 MED ORDER — FAMOTIDINE 20 MG PO TABS: 20.0000 mg | ORAL_TABLET | Freq: Once | ORAL | Status: AC

## 2021-02-05 MED ORDER — CHLORHEXIDINE GLUCONATE 0.12 % MT SOLN: 15.0000 mL | Freq: Once | OROMUCOSAL | Status: AC

## 2021-02-05 MED ORDER — CEFAZOLIN SODIUM-DEXTROSE 2-4 GM/100ML-% IV SOLN
2.0000 g | INTRAVENOUS | Status: AC
  Administered 2021-02-06: 2 g via INTRAVENOUS

## 2021-02-05 MED ORDER — LACTATED RINGERS IV SOLN: INTRAVENOUS | Status: DC

## 2021-02-06 ENCOUNTER — Encounter
Admission: RE | Disposition: A | Discharge status: Home / Self Care | Payer: Self-pay | Source: Home / Self Care | Attending: Urology

## 2021-02-06 ENCOUNTER — Ambulatory Visit: Payer: No Typology Code available for payment source

## 2021-02-06 ENCOUNTER — Other Ambulatory Visit: Payer: No Typology Code available for payment source

## 2021-02-06 ENCOUNTER — Ambulatory Visit
Admission: RE | Admit: 2021-02-06 | Discharge: 2021-02-06 | Disposition: A | Discharge status: Home / Self Care | Payer: No Typology Code available for payment source | Attending: Urology | Admitting: Urology

## 2021-02-06 ENCOUNTER — Ambulatory Visit: Payer: No Typology Code available for payment source | Admitting: Anesthesiology

## 2021-02-06 ENCOUNTER — Other Ambulatory Visit: Payer: Self-pay

## 2021-02-06 ENCOUNTER — Encounter: Payer: Self-pay | Admitting: Urology

## 2021-02-06 DIAGNOSIS — Z79899 Other long term (current) drug therapy: Secondary | ICD-10-CM | POA: Diagnosis not present

## 2021-02-06 DIAGNOSIS — I10 Essential (primary) hypertension: Secondary | ICD-10-CM | POA: Diagnosis not present

## 2021-02-06 DIAGNOSIS — Z91013 Allergy to seafood: Secondary | ICD-10-CM | POA: Diagnosis not present

## 2021-02-06 DIAGNOSIS — Z7982 Long term (current) use of aspirin: Secondary | ICD-10-CM | POA: Diagnosis not present

## 2021-02-06 DIAGNOSIS — N132 Hydronephrosis with renal and ureteral calculous obstruction: Secondary | ICD-10-CM | POA: Insufficient documentation

## 2021-02-06 DIAGNOSIS — N201 Calculus of ureter: Secondary | ICD-10-CM

## 2021-02-06 DIAGNOSIS — Z87442 Personal history of urinary calculi: Secondary | ICD-10-CM | POA: Insufficient documentation

## 2021-02-06 HISTORY — PX: CYSTOSCOPY/URETEROSCOPY/HOLMIUM LASER/STENT PLACEMENT: SHX6546

## 2021-02-06 SURGERY — CYSTOSCOPY/URETEROSCOPY/HOLMIUM LASER/STENT PLACEMENT
Anesthesia: General | Site: Ureter | Laterality: Left

## 2021-02-06 MED ORDER — MIDAZOLAM HCL 2 MG/2ML IJ SOLN
INTRAMUSCULAR | Status: DC | PRN
  Administered 2021-02-06: 2 mg via INTRAVENOUS

## 2021-02-06 MED ORDER — FENTANYL CITRATE (PF) 100 MCG/2ML IJ SOLN: 25.0000 ug | INTRAMUSCULAR | Status: DC | PRN

## 2021-02-06 MED ORDER — TAMSULOSIN HCL 0.4 MG PO CAPS: 0.4000 mg | ORAL_CAPSULE | Freq: Every day | ORAL | 0 refills | Status: DC

## 2021-02-06 MED ORDER — ACETAMINOPHEN 10 MG/ML IV SOLN
INTRAVENOUS | Status: AC
  Filled 2021-02-06: qty 100

## 2021-02-06 MED ORDER — PROPOFOL 10 MG/ML IV BOLUS
INTRAVENOUS | Status: DC | PRN
  Administered 2021-02-06: 150 mg via INTRAVENOUS

## 2021-02-06 MED ORDER — OXYBUTYNIN CHLORIDE 5 MG PO TABS: 5.0000 mg | ORAL_TABLET | Freq: Three times a day (TID) | ORAL | 0 refills | Status: DC | PRN

## 2021-02-06 MED ORDER — ACETAMINOPHEN 10 MG/ML IV SOLN
INTRAVENOUS | Status: DC | PRN
  Administered 2021-02-06: 1000 mg via INTRAVENOUS

## 2021-02-06 MED ORDER — OXYCODONE-ACETAMINOPHEN 5-325 MG PO TABS: 1.0000 | ORAL_TABLET | Freq: Four times a day (QID) | ORAL | 0 refills | Status: DC | PRN

## 2021-02-06 MED ORDER — SODIUM CHLORIDE 0.9 % IR SOLN
Status: DC | PRN
  Administered 2021-02-06: 3000 mL

## 2021-02-06 MED ORDER — LACTATED RINGERS IV SOLN: INTRAVENOUS | Status: DC | PRN

## 2021-02-06 MED ORDER — ONDANSETRON HCL 4 MG/2ML IJ SOLN: 4.0000 mg | Freq: Once | INTRAMUSCULAR | Status: DC | PRN

## 2021-02-06 MED ORDER — FENTANYL CITRATE (PF) 100 MCG/2ML IJ SOLN
INTRAMUSCULAR | Status: DC | PRN
  Administered 2021-02-06: 100 ug via INTRAVENOUS

## 2021-02-06 MED ORDER — FAMOTIDINE 20 MG PO TABS
ORAL_TABLET | ORAL | Status: AC
  Administered 2021-02-06: 20 mg via ORAL
  Filled 2021-02-06: qty 1

## 2021-02-06 MED ORDER — CHLORHEXIDINE GLUCONATE 0.12 % MT SOLN
OROMUCOSAL | Status: AC
  Administered 2021-02-06: 15 mL via OROMUCOSAL
  Filled 2021-02-06: qty 15

## 2021-02-06 MED ORDER — DEXAMETHASONE SODIUM PHOSPHATE 10 MG/ML IJ SOLN
INTRAMUSCULAR | Status: DC | PRN
  Administered 2021-02-06: 10 mg via INTRAVENOUS

## 2021-02-06 MED ORDER — CEFAZOLIN SODIUM-DEXTROSE 2-4 GM/100ML-% IV SOLN
INTRAVENOUS | Status: AC
  Filled 2021-02-06: qty 100

## 2021-02-06 MED ORDER — 0.9 % SODIUM CHLORIDE (POUR BTL) OPTIME
TOPICAL | Status: DC | PRN
  Administered 2021-02-06: 15 mL

## 2021-02-06 MED ORDER — ONDANSETRON HCL 4 MG/2ML IJ SOLN
INTRAMUSCULAR | Status: DC | PRN
  Administered 2021-02-06: 4 mg via INTRAVENOUS

## 2021-02-06 MED ORDER — MIDAZOLAM HCL 2 MG/2ML IJ SOLN
INTRAMUSCULAR | Status: AC
  Filled 2021-02-06: qty 2

## 2021-02-06 MED ORDER — FENTANYL CITRATE (PF) 100 MCG/2ML IJ SOLN
INTRAMUSCULAR | Status: AC
  Filled 2021-02-06: qty 2

## 2021-02-06 MED ORDER — IOPAMIDOL (ISOVUE-200) INJECTION 41%
INTRAVENOUS | Status: DC | PRN
  Administered 2021-02-06: 5 mL via INTRAVENOUS

## 2021-02-06 MED ORDER — LIDOCAINE HCL (CARDIAC) PF 100 MG/5ML IV SOSY
PREFILLED_SYRINGE | INTRAVENOUS | Status: DC | PRN
  Administered 2021-02-06: 100 mg via INTRAVENOUS

## 2021-02-06 MED ORDER — SUGAMMADEX SODIUM 200 MG/2ML IV SOLN
INTRAVENOUS | Status: DC | PRN
  Administered 2021-02-06: 200 mg via INTRAVENOUS

## 2021-02-06 MED ORDER — ROCURONIUM BROMIDE 100 MG/10ML IV SOLN
INTRAVENOUS | Status: DC | PRN
  Administered 2021-02-06: 50 mg via INTRAVENOUS

## 2021-02-06 SURGICAL SUPPLY — 31 items
BAG DRAIN CYSTO-URO LG1000N (MISCELLANEOUS) ×2 IMPLANT
BASKET ZERO TIP 1.9FR (BASKET) IMPLANT
BRUSH SCRUB EZ 1% IODOPHOR (MISCELLANEOUS) ×2 IMPLANT
BSKT STON RTRVL ZERO TP 1.9FR (BASKET)
CATH URET FLEX-TIP 2 LUMEN 10F (CATHETERS) IMPLANT
CATH URETL OPEN 5X70 (CATHETERS) ×2 IMPLANT
CNTNR SPEC 2.5X3XGRAD LEK (MISCELLANEOUS)
CONT SPEC 4OZ STER OR WHT (MISCELLANEOUS)
CONT SPEC 4OZ STRL OR WHT (MISCELLANEOUS)
CONTAINER SPEC 2.5X3XGRAD LEK (MISCELLANEOUS) IMPLANT
DRAPE UTILITY 15X26 TOWEL STRL (DRAPES) ×2 IMPLANT
GAUZE 4X4 16PLY ~~LOC~~+RFID DBL (SPONGE) ×4 IMPLANT
GLIDEWIRE STIFF .35X180X3 HYDR (WIRE) ×2 IMPLANT
GLOVE SURG ENC MOIS LTX SZ6.5 (GLOVE) ×2 IMPLANT
GOWN STRL REUS W/ TWL LRG LVL3 (GOWN DISPOSABLE) ×2 IMPLANT
GOWN STRL REUS W/TWL LRG LVL3 (GOWN DISPOSABLE) ×4
GUIDEWIRE GREEN .038 145CM (MISCELLANEOUS) ×2 IMPLANT
GUIDEWIRE STR DUAL SENSOR (WIRE) ×2 IMPLANT
INFUSOR MANOMETER BAG 3000ML (MISCELLANEOUS) ×2 IMPLANT
IV NS IRRIG 3000ML ARTHROMATIC (IV SOLUTION) ×2 IMPLANT
KIT TURNOVER CYSTO (KITS) ×2 IMPLANT
MANIFOLD NEPTUNE II (INSTRUMENTS) ×2 IMPLANT
PACK CYSTO AR (MISCELLANEOUS) ×2 IMPLANT
SET CYSTO W/LG BORE CLAMP LF (SET/KITS/TRAYS/PACK) ×2 IMPLANT
SHEATH URETERAL 12FR 45CM (SHEATH) ×2 IMPLANT
SHEATH URETERAL 12FRX35CM (MISCELLANEOUS) IMPLANT
STENT URET 6FRX24 CONTOUR (STENTS) ×2 IMPLANT
STENT URET 6FRX26 CONTOUR (STENTS) IMPLANT
SURGILUBE 2OZ TUBE FLIPTOP (MISCELLANEOUS) ×2 IMPLANT
TRACTIP FLEXIVA PULSE ID 200 (Laser) ×2 IMPLANT
WATER STERILE IRR 1000ML POUR (IV SOLUTION) ×2 IMPLANT

## 2021-02-06 NOTE — Discharge Instructions (Addendum)
You have a ureteral stent in place.  This is a tube that extends from your kidney to your bladder.  This may cause urinary bleeding, burning with urination, and urinary frequency.  Please call our office or present to the ED if you develop fevers >101 or pain which is not able to be controlled with oral pain medications.  You may be given either Flomax and/ or ditropan to help with bladder spasms and stent pain in addition to pain medications.    [facility] [address], [address] [place], [place] [address] [phone]  AMBULATORY SURGERY  DISCHARGE INSTRUCTIONS   The drugs that you were given will stay in your system until tomorrow so for the next 24 hours you should not:  Drive an automobile Make any legal decisions Drink any alcoholic beverage   You may resume regular meals tomorrow.  Today it is better to start with liquids and gradually work up to solid foods.  You may eat anything you prefer, but it is better to start with liquids, then soup and crackers, and gradually work up to solid foods.   Please notify your doctor immediately if you have any unusual bleeding, trouble breathing, redness and pain at the surgery site, drainage, fever, or pain not relieved by medication.    Additional Instructions:   Please contact your physician with any problems or Same Day Surgery at [phone], Monday through Friday 6 am to 4 pm, or  at [facility] Main number at [phone].  

## 2021-02-06 NOTE — Op Note (Signed)
Date of procedure: 02/06/21  Preoperative diagnosis:  Left proximal ureteral calculus  Postoperative diagnosis:  Same as above Left impacted ureteral stone  Procedure: Left ureteroscopy with laser lithotripsy Left ureteral stent placement Left retrograde pyelogram Interpretation fluoroscopy less than 30 minutes  Surgeon: Vanna Scotland, MD  Anesthesia: General  Complications: None  Intraoperative findings: Impacted a millimeter left proximal ureteral calculus with overlying mucosa, high-grade obstruction.  Significant ureteral edema with mucosal abrasions appreciated without extravasation.  Stent left without tether for planned prolonged stent x2 weeks.  EBL: Minimal  Specimens: None  Drains: 6 x 24 French double-J ureteral stent on left  Indication: Ronald Sweeney is a 54 y.o. patient with a millimeter left proximal ureteral calculus status post failed ESWL.  After reviewing the management options for treatment, he elected to proceed with the above surgical procedure(s). We have discussed the potential benefits and risks of the procedure, side effects of the proposed treatment, the likelihood of the patient achieving the goals of the procedure, and any potential problems that might occur during the procedure or recuperation. Informed consent has been obtained.  Description of procedure:  The patient was taken to the operating room and general anesthesia was induced.  The patient was placed in the dorsal lithotomy position, prepped and draped in the usual sterile fashion, and preoperative antibiotics were administered. A preoperative time-out was performed.   A 21 French the scope was advanced per urethra into the bladder.  On scout imaging, the stone could be seen in the proximal ureter.  And then cannulated the left UO with an open-ended ureteral catheter and Cook injected contrast for retrograde pyelogram.  There is a filling defect in the left proximal ureter with  hydronephrosis and only a small amount of contrast into the collecting system consistent with a high-grade obstruction.  I then had some difficulty getting the sensor wire to traverse up the ureter.  I exchanges for an angled Glidewire but was still unsuccessful secondary to J hooking of the left distal ureter which had been seen on retrograde pyelogram.  Ultimately, I needed to use a semirigid ureteroscope which have a small false pass from the previous attempts at wire placement was identified in the true lumen was identified more laterally.  I was able to advance my sensor wire under direct visualization which initially failed to pass around the stone but ultimately, I was able to under direct visualization get the wire around the stone into the kidney.  I then backed the semirigid ureteroscope down the length of the ureter and excluded the wire using as a safety wire.  I was able to get all the way up to the level of the stone with a semirigid ureteroscope and brought in a 242 m laser fiber and using settings of 0.3 J and 60 Hz, the stone was dusted.  A few pieces retropulsed.  Notably, direct visualization of the stone revealed that the stone was heavily impacted, there was an area with overlying mucosa and a blood vessel.  Photos were taken to document this.  A second Super Stiff wire was then placed up to the level of the kidney as a working wire.  I was able to advance a 12/14 [place] Cook ureteral access sheath up to the level of the mid proximal ureter.  The inner cannula and Super Stiff wire were removed.  Unfortunately, I was not able to navigate around where the stone and previous been lodged but was able to advance a Super Stiff  wire through the scope and the scope over the wire using railroad technique into the collecting system.  The remainder of the stones were dusted/obliterated.  A final retrograde pyelogram created a roadmap to ensure that each and every calyx has been cleared of all stone  burden.  The scope was then backed down the length of the ureter.  Notably, in the area of the proximal ureter, there was some mucosal abrasion and edema consistent with the previously impacted stone at this location.  There is no contrast extravasation appreciated.  There is also a small fragment in the mid ureter which have been partially embedded into the wall which was removed.  A small distal fragment was also identified and cleared.  Finally, a safety wire was backloaded over rigid cystoscope.  A 6 x 24 French double-J ureteral stent was advanced over the wire up to the level of the kidney.  Upon removing the wire, there is a full coil noted both within the renal pelvis as well as within the bladder.  The bladder was then drained.  The patient was then cleaned and dried, repositioned in the supine position, reversed from anesthesia, and taken to PACU in stable condition.  Plan: Given the degree of impaction, will leave the stent for 2 weeks to allow for ureteral mucosal healing.  He was provided with prescriptions.  Intraoperative findings were discussed with his wife, all questions answered.  Vanna Scotland, M.D.

## 2021-02-06 NOTE — Anesthesia Procedure Notes (Addendum)
Procedure Name: Intubation Date/Time: 02/06/2021 12:22 PM Performed by: Alex Gardener, CRNA Pre-anesthesia Checklist: Patient identified, Patient being monitored, Timeout performed, Emergency Drugs available and Suction available Patient Re-evaluated:Patient Re-evaluated prior to induction Oxygen Delivery Method: Circle system utilized Preoxygenation: Pre-oxygenation with 100% oxygen Induction Type: IV induction Ventilation: Mask ventilation without difficulty Laryngoscope Size: McGraph and 4 Grade View: Grade I Tube type: Oral Tube size: 7.5 mm Number of attempts: 1 Airway Equipment and Method: Stylet Placement Confirmation: ETT inserted through vocal cords under direct vision, positive ETCO2 and breath sounds checked- equal and bilateral Secured at: 23 cm Tube secured with: Tape Dental Injury: Teeth and Oropharynx as per pre-operative assessment

## 2021-02-06 NOTE — Anesthesia Preprocedure Evaluation (Signed)
Anesthesia Evaluation  Patient identified by MRN, date of birth, ID band Patient awake    Reviewed: Allergy & Precautions, H&P , NPO status , Patient's Chart, lab work & pertinent test results, reviewed documented beta blocker date and time   Airway Mallampati: II  TM Distance: >3 FB Neck ROM: full    Dental  (+) Teeth Intact   Pulmonary neg pulmonary ROS,    Pulmonary exam normal        Cardiovascular Exercise Tolerance: Good hypertension, On Medications negative cardio ROS Normal cardiovascular exam Rhythm:regular Rate:Normal     Neuro/Psych negative neurological ROS  negative psych ROS   GI/Hepatic negative GI ROS, Neg liver ROS,   Endo/Other  negative endocrine ROS  Renal/GU negative Renal ROS  negative genitourinary   Musculoskeletal   Abdominal   Peds  Hematology negative hematology ROS (+)   Anesthesia Other Findings Past Medical History: No date: Allergy     Comment:  seasonal No date: History of kidney stones No date: Hypertension Past Surgical History: 11/29/2016: COLONOSCOPY WITH PROPOFOL; N/A     Comment:  Procedure: COLONOSCOPY WITH PROPOFOL;  Surgeon: Midge Minium, MD;  Location: [facility];  Service:               Endoscopy;  Laterality: N/A; 12/01/2020: EXTRACORPOREAL SHOCK WAVE LITHOTRIPSY; Left     Comment:  Procedure: EXTRACORPOREAL SHOCK WAVE LITHOTRIPSY (ESWL);              Surgeon: Riki Altes, MD;  Location: [facility];                Service: Urology;  Laterality: Left; BMI    Body Mass Index: 27.76 kg/m     Reproductive/Obstetrics negative OB ROS                             Anesthesia Physical Anesthesia Plan  ASA: 2  Anesthesia Plan: General LMA   Post-op Pain Management:    Induction:   PONV Risk Score and Plan:   Airway Management Planned:   Additional Equipment:   Intra-op Plan:   Post-operative Plan:    Informed Consent: I have reviewed the patients History and Physical, chart, labs and discussed the procedure including the risks, benefits and alternatives for the proposed anesthesia with the patient or authorized representative who has indicated his/her understanding and acceptance.     Dental Advisory Given  Plan Discussed with: CRNA  Anesthesia Plan Comments:         Anesthesia Quick Evaluation

## 2021-02-06 NOTE — Interval H&P Note (Signed)
History and Physical Interval Note:  02/06/2021 11:57 AM  Ronald Sweeney  has presented today for surgery, with the diagnosis of Left Ureteral calculus.  The various methods of treatment have been discussed with the patient and family. After consideration of risks, benefits and other options for treatment, the patient has consented to  Procedure(s): CYSTOSCOPY/URETEROSCOPY/HOLMIUM LASER/STENT PLACEMENT (Left) as a surgical intervention.  The patient's history has been reviewed, patient examined, no change in status, stable for surgery.  I have reviewed the patient's chart and labs.  Questions were answered to the patient's satisfaction.    RRR CTAB  Vanna Scotland

## 2021-02-06 NOTE — Transfer of Care (Signed)
Immediate Anesthesia Transfer of Care Note  Patient: Ronald Sweeney  Procedure(s) Performed: CYSTOSCOPY/URETEROSCOPY/HOLMIUM LASER/STENT PLACEMENT (Left: Ureter)  Patient Location: PACU  Anesthesia Type:General  Level of Consciousness: awake  Airway & Oxygen Therapy: Patient Spontanous Breathing  Post-op Assessment: Report given to RN  Post vital signs: stable  Last Vitals:  Vitals Value Taken Time  BP    Temp 36.6 C 02/06/21 1322  Pulse 65 02/06/21 1322  Resp 15 02/06/21 1322  SpO2 100 % 02/06/21 1322  Vitals shown include unvalidated device data.  Last Pain:  Vitals:   02/06/21 1322  TempSrc: Oral  PainSc:          Complications: No notable events documented.

## 2021-02-07 ENCOUNTER — Encounter: Payer: Self-pay | Admitting: Urology

## 2021-02-07 NOTE — Anesthesia Postprocedure Evaluation (Signed)
Anesthesia Post Note  Patient: Ronald Sweeney  Procedure(s) Performed: CYSTOSCOPY/URETEROSCOPY/HOLMIUM LASER/STENT PLACEMENT (Left: Ureter)  Patient location during evaluation: PACU Anesthesia Type: General Level of consciousness: awake and alert Pain management: pain level controlled Vital Signs Assessment: post-procedure vital signs reviewed and stable Respiratory status: spontaneous breathing, nonlabored ventilation, respiratory function stable and patient connected to nasal cannula oxygen Cardiovascular status: blood pressure returned to baseline and stable Postop Assessment: no apparent nausea or vomiting Anesthetic complications: no   No notable events documented.   Last Vitals:  Vitals:   02/06/21 1400 02/06/21 1407  BP:  (!) 146/84  Pulse: 62 63  Resp: 12 14  Temp:  36.8 C  SpO2: 100% 99%    Last Pain:  Vitals:   02/06/21 1407  TempSrc: Temporal  PainSc: 0-No pain                 Yevette Edwards

## 2021-02-09 ENCOUNTER — Encounter: Payer: Self-pay | Admitting: Urology

## 2021-02-14 ENCOUNTER — Other Ambulatory Visit: Payer: Self-pay | Admitting: Family Medicine

## 2021-02-14 DIAGNOSIS — I10 Essential (primary) hypertension: Secondary | ICD-10-CM

## 2021-02-15 ENCOUNTER — Encounter: Payer: Self-pay | Admitting: Urology

## 2021-02-22 ENCOUNTER — Encounter: Payer: Self-pay | Admitting: Urology

## 2021-02-22 NOTE — Progress Notes (Signed)
   02/23/2021   CC:  Chief Complaint  Patient presents with   Cysto Stent Removal    HPI: Ronald Sweeney is a 55 y.o. male with PMH hypertension, incidental left adrenal nodule, and nephrolithiasis who underwent ESWL with Dr. Lonna Cobb on 12/01/2020 for management of an 8 mm proximal left ureteral stone who returns today for stent removal.   01/13/2021 DG abdomen showed the stone overlying the left ureter is unchanged in position and continues to measure 9 mm. No other changes.   On 02/06/2021 he underwent cystoscopy/ureteroscopy/Holmium laser/ stent placement.   He is accompanied today by his wife. He is feeling well today and states his back pain has resolved.   Vitals:   02/23/21 1159  BP: (!) 141/90  Pulse: 88  NED. A&Ox3.   No respiratory distress   Abd soft, NT, ND Normal phallus with bilateral descended testicles   Cystoscopy/ Stent removal procedure  Patient identification was confirmed, informed consent was obtained, and patient was prepped using Betadine solution.  Lidocaine jelly was administered per urethral meatus.    Preoperative abx where received prior to procedure.    Procedure: - Flexible cystoscope introduced, without any difficulty.   - Thorough search of the bladder revealed:    normal urethral meatus  Stent seen emanating from LEFT ureteral orifice, grasped with stent graspers, and removed in entirety.     Post-Procedure: - Patient tolerated the procedure well   Assessment/Plan:  Ureteroscopy/ stent removal  - PPX Bactrim was given today  - Stent removed today from left ureteral orifice  - Counseled him in side effects of stent removal including increased urination, leaking urine, and blood in urine.    Follow-up with RUS in 1 month   I,Richie Bonanno,acting as a scribe for Vanna Scotland, MD.,have documented all relevant documentation on the behalf of Vanna Scotland, MD,as directed by  Vanna Scotland, MD while in the presence of Vanna Scotland, MD.  I have reviewed the above documentation for accuracy and completeness, and I agree with the above.   Vanna Scotland, MD

## 2021-02-23 ENCOUNTER — Encounter: Payer: No Typology Code available for payment source | Admitting: Urology

## 2021-02-23 ENCOUNTER — Other Ambulatory Visit: Payer: Self-pay

## 2021-02-23 ENCOUNTER — Ambulatory Visit (INDEPENDENT_AMBULATORY_CARE_PROVIDER_SITE_OTHER): Payer: No Typology Code available for payment source | Admitting: Urology

## 2021-02-23 VITALS — BP 141/90 | HR 88

## 2021-02-23 DIAGNOSIS — N201 Calculus of ureter: Secondary | ICD-10-CM

## 2021-02-23 MED ORDER — SULFAMETHOXAZOLE-TRIMETHOPRIM 800-160 MG PO TABS
1.0000 | ORAL_TABLET | Freq: Once | ORAL | Status: AC
  Administered 2021-02-23: 1 via ORAL

## 2021-02-24 LAB — MICROSCOPIC EXAMINATION
Bacteria, UA: NONE SEEN
RBC, Urine: >30 /hpf — AB (ref 0–2)

## 2021-02-24 LAB — URINALYSIS, COMPLETE
Bilirubin, UA: NEGATIVE
Glucose, UA: NEGATIVE
Nitrite, UA: NEGATIVE
Specific Gravity, UA: 1.015 (ref 1.005–1.030)
Urobilinogen, Ur: 0.2 mg/dL (ref 0.2–1.0)
pH, UA: 7 (ref 5.0–7.5)

## 2021-03-23 ENCOUNTER — Other Ambulatory Visit: Payer: Self-pay

## 2021-03-23 ENCOUNTER — Ambulatory Visit
Admission: RE | Admit: 2021-03-23 | Discharge: 2021-03-23 | Disposition: A | Discharge status: Home / Self Care | Payer: No Typology Code available for payment source | Source: Ambulatory Visit | Attending: Urology | Admitting: Urology

## 2021-03-23 DIAGNOSIS — N201 Calculus of ureter: Secondary | ICD-10-CM | POA: Insufficient documentation

## 2021-03-27 NOTE — Progress Notes (Signed)
03/28/21 4:32 PM   Ronald Sweeney 02-23-1966 [id number]  Referring provider:  Smitty Cords, DO [address] [place],  [place] [address] Chief Complaint  Patient presents with   Hydronephrosis   Follow-up     HPI: Ronald Sweeney is a 55 y.o.male with a personal history of incidental left adrenal nodule, and nephrolithiasis, who presents today for 4 week follow-up with RUS.   He is s/p ESWL with Dr.Stoioff on 12/01/2020 for management or a 8 mm proximal left ureteral stone.   01/13/2021 DG abdomen showed the stone overlying the left ureter is unchanged in position and continues to measure 9 mm. No other changes.  He is s/p ureteroscopy on 02/06/2021 . Stone was remarkably impacted the stent was eft for 2 weeks given the degree of impaction.  03/23/2021 RUS revealed no hydronephrosis or obstructive uropathy, 8 mm nonobstructive left renal nephrolithiasis, simple renal cysts measuring up to 1.0 cm, and enlarged prostate.   He reports today that he is doing well he has no new urinary symptoms today.   PMH: Past Medical History:  Diagnosis Date   Allergy    seasonal   History of kidney stones    Hypertension     Surgical History: Past Surgical History:  Procedure Laterality Date   COLONOSCOPY WITH PROPOFOL N/A 11/29/2016   Procedure: COLONOSCOPY WITH PROPOFOL;  Surgeon: Midge Minium, MD;  Location: [facility];  Service: Endoscopy;  Laterality: N/A;   CYSTOSCOPY/URETEROSCOPY/HOLMIUM LASER/STENT PLACEMENT Left 02/06/2021   Procedure: CYSTOSCOPY/URETEROSCOPY/HOLMIUM LASER/STENT PLACEMENT;  Surgeon: Vanna Scotland, MD;  Location: [facility];  Service: Urology;  Laterality: Left;   EXTRACORPOREAL SHOCK WAVE LITHOTRIPSY Left 12/01/2020   Procedure: EXTRACORPOREAL SHOCK WAVE LITHOTRIPSY (ESWL);  Surgeon: Riki Altes, MD;  Location: [facility];  Service: Urology;  Laterality: Left;    Home Medications:  Allergies as of 03/28/2021       Reactions   Shellfish  Allergy Shortness Of Breath, Swelling        Medication List        Accurate as of March 28, 2021  4:32 PM. If you have any questions, ask your nurse or doctor.          STOP taking these medications    ondansetron 8 MG tablet Commonly known as: ZOFRAN Stopped by: Vanna Scotland, MD   oxybutynin 5 MG tablet Commonly known as: DITROPAN Stopped by: Vanna Scotland, MD   oxyCODONE-acetaminophen 5-325 MG tablet Commonly known as: PERCOCET/ROXICET Stopped by: Vanna Scotland, MD   tamsulosin 0.4 MG Caps capsule Commonly known as: FLOMAX Stopped by: Vanna Scotland, MD       TAKE these medications    amLODipine 10 MG tablet Commonly known as: NORVASC TAKE 1 TABLET(10 MG) BY MOUTH DAILY   multivitamin with minerals tablet Take 1 tablet by mouth daily.        Allergies:  Allergies  Allergen Reactions   Shellfish Allergy Shortness Of Breath and Swelling    Family History: Family History  Problem Relation Age of Onset   Cancer Mother        colon cancer   Colon polyps Mother     Social History:  reports that he has never smoked. He has never used smokeless tobacco. He reports current alcohol use. He reports that he does not use drugs.   Physical Exam: BP (!) 181/102   Pulse 94   Ht 5\' 9"  (1.753 m)   Wt 188 lb (85.3 kg)   BMI 27.76  kg/m   Constitutional:  Alert and oriented, No acute distress. HEENT: [place] AT, moist mucus membranes.  Trachea midline, no masses. Cardiovascular: No clubbing, cyanosis, or edema. Respiratory: Normal respiratory effort, no increased work of breathing. Skin: No rashes, bruises or suspicious lesions. Neurologic: Grossly intact, no focal deficits, moving all 4 extremities. Psychiatric: Normal mood and affect.  Laboratory Data:  Lab Results  Component Value Date   CREATININE 1.40 (H) 02/03/2021    Lab Results  Component Value Date   PSA 1.49 01/10/2016      Lab Results  Component Value Date   HGBA1C 5.5  10/12/2016     Pertinent Imaging: CLINICAL DATA:  Follow-up examination for left ureteral calculus. History of recent ureteroscopy and stenting on 02/06/2021.   EXAM: RENAL / URINARY TRACT ULTRASOUND COMPLETE   COMPARISON:  Prior radiograph from 01/13/2021.   FINDINGS: Right Kidney:   Renal measurements: 10.5 x 4.4 x 5.1 cm = volume: 121.7 mL. Renal echogenicity within normal limits. No nephrolithiasis or hydronephrosis. 1.0 x 0.7 x 1.0 cm benign appearing cyst present at the lower pole.   Left Kidney:   Renal measurements: 10.3 x 5.8 x 4.7 cm = volume: 148.4 mL. Renal echogenicity within normal limits. 8 mm nonobstructive calculus present at the lower pole. No hydronephrosis. 1.0 x 0.8 x 1.0 cm benign appearing cyst present at the interpolar region.   Bladder:   Appears normal for degree of bladder distention. Bilateral ureteral jets are visualized.   Other:   Enlarged prostate measures 4.9 cm in diameter with somewhat lobulated contour.   IMPRESSION: 1. No hydronephrosis or obstructive uropathy. 2. 8 mm nonobstructive left renal nephrolithiasis. 3. Simple bilateral renal cysts measuring up to 1.0 cm as above. 4. Enlarged prostate.     Electronically Signed   By: Rise Mu M.D.   On: 03/24/2021 04:   Assessment & Plan:    History of stones   - RUS showed evidence of stones in lower pole but suspect debris from left ureteroscopy  - Stone was not able to be sent for stone composition  - We discussed general stone prevention techniques including drinking plenty water with goal of producing 2.5 L urine daily, increased citric acid intake, avoidance of high oxalate containing foods, and decreased salt intake.  Information about dietary recommendations given today.    Follow-up in 6 months with KUB  I,Kailey Littlejohn,acting as a scribe for Vanna Scotland, MD.,have documented all relevant documentation on the behalf of Vanna Scotland, MD,as directed  by  Vanna Scotland, MD while in the presence of Vanna Scotland, MD.  I have reviewed the above documentation for accuracy and completeness, and I agree with the above.   Vanna Scotland, MD   [facility] [address], [address] [place], [place] [address] [phone]

## 2021-03-28 ENCOUNTER — Other Ambulatory Visit: Payer: Self-pay

## 2021-03-28 ENCOUNTER — Ambulatory Visit: Payer: No Typology Code available for payment source | Admitting: Urology

## 2021-03-28 VITALS — BP 181/102 | HR 94 | Ht 69.0 in | Wt 188.0 lb

## 2021-03-28 DIAGNOSIS — N201 Calculus of ureter: Secondary | ICD-10-CM | POA: Diagnosis not present

## 2021-05-30 ENCOUNTER — Other Ambulatory Visit: Payer: Self-pay | Admitting: Family Medicine

## 2021-05-30 DIAGNOSIS — I10 Essential (primary) hypertension: Secondary | ICD-10-CM

## 2021-05-31 ENCOUNTER — Encounter: Payer: Self-pay | Admitting: Family Medicine

## 2021-06-01 NOTE — Telephone Encounter (Signed)
Requested medications are due for refill today.  yes  Requested medications are on the active medications list.  yes  Last refill. 10/21/2020  Future visit scheduled.   no  Notes to clinic.  Pt is more than 6 months overdue for OV. See notes from conversation 05/31/2021 regarding need for follow up.

## 2021-06-13 ENCOUNTER — Other Ambulatory Visit: Payer: Self-pay | Admitting: Family Medicine

## 2021-06-13 DIAGNOSIS — I10 Essential (primary) hypertension: Secondary | ICD-10-CM

## 2021-06-13 MED ORDER — AMLODIPINE BESYLATE 10 MG PO TABS: 10.0000 mg | ORAL_TABLET | Freq: Every day | ORAL | 0 refills | Status: DC

## 2021-06-14 ENCOUNTER — Other Ambulatory Visit: Payer: Self-pay | Admitting: Family Medicine

## 2021-06-14 DIAGNOSIS — I10 Essential (primary) hypertension: Secondary | ICD-10-CM

## 2021-06-14 MED ORDER — AMLODIPINE BESYLATE 10 MG PO TABS: 10.0000 mg | ORAL_TABLET | Freq: Every day | ORAL | 0 refills | Status: DC

## 2021-06-20 ENCOUNTER — Other Ambulatory Visit: Payer: Self-pay

## 2021-06-20 ENCOUNTER — Ambulatory Visit: Payer: Self-pay | Admitting: Family Medicine

## 2021-06-20 ENCOUNTER — Encounter: Payer: Self-pay | Admitting: Family Medicine

## 2021-06-20 DIAGNOSIS — I10 Essential (primary) hypertension: Secondary | ICD-10-CM

## 2021-06-20 MED ORDER — AMLODIPINE BESYLATE 10 MG PO TABS: 10.0000 mg | ORAL_TABLET | Freq: Every day | ORAL | 3 refills | Status: DC

## 2021-06-20 NOTE — Patient Instructions (Addendum)
Thank you for coming to the office today.  Keep on Amlodipine 10mg  daily consistently and keep improving diet lifestyle low sodium  Consider the following medications as next options if we need a 2nd pill.  Spironolactone - this is the strongest add on we could consider it is a diuretic fluid pill. Usually increased urination is a common side effect but may not be very noticeable.  Carvedilol or Metoprolol (beta blocker)  Hydrochlorothiazide (HCTZ) we could re-try this one WITHOUT the other medication - you took Lisionpril/Losartan with this med in the past.  Please schedule a Follow-up Appointment to: Return in about 6 months (around 12/19/2021) for 6 month follow-up HTN.  If you have any other questions or concerns, please feel free to call the office or send a message through MyChart. You may also schedule an earlier appointment if necessary.  Additionally, you may be receiving a survey about your experience at our office within a few days to 1 week by e-mail or mail. We value your feedback.  12/21/2021, DO [facility], [facility]  DASH Eating Plan DASH stands for Dietary Approaches to Stop Hypertension. The DASH eating plan is a healthy eating plan that has been shown to: Reduce high blood pressure (hypertension). Reduce your risk for type 2 diabetes, heart disease, and stroke. Help with weight loss. What are tips for following this plan? Reading food labels Check food labels for the amount of salt (sodium) per serving. Choose foods with less than 5 percent of the Daily Value of sodium. Generally, foods with less than 300 milligrams (mg) of sodium per serving fit into this eating plan. To find whole grains, look for the word "whole" as the first word in the ingredient list. Shopping Buy products labeled as "low-sodium" or "no salt added." Buy fresh foods. Avoid canned foods and pre-made or frozen meals. Cooking Avoid adding salt when cooking. Use salt-free  seasonings or herbs instead of table salt or sea salt. Check with your health care provider or pharmacist before using salt substitutes. Do not fry foods. Cook foods using healthy methods such as baking, boiling, grilling, roasting, and broiling instead. Cook with heart-healthy oils, such as olive, canola, avocado, soybean, or sunflower oil. Meal planning  Eat a balanced diet that includes: 4 or more servings of fruits and 4 or more servings of vegetables each day. Try to fill one-half of your plate with fruits and vegetables. 6-8 servings of whole grains each day. Less than 6 oz (170 g) of lean meat, poultry, or fish each day. A 3-oz (85-g) serving of meat is about the same size as a deck of cards. One egg equals 1 oz (28 g). 2-3 servings of low-fat dairy each day. One serving is 1 cup (237 mL). 1 serving of nuts, seeds, or beans 5 times each week. 2-3 servings of heart-healthy fats. Healthy fats called omega-3 fatty acids are found in foods such as walnuts, flaxseeds, fortified milks, and eggs. These fats are also found in cold-water fish, such as sardines, salmon, and mackerel. Limit how much you eat of: Canned or prepackaged foods. Food that is high in trans fat, such as some fried foods. Food that is high in saturated fat, such as fatty meat. Desserts and other sweets, sugary drinks, and other foods with added sugar. Full-fat dairy products. Do not salt foods before eating. Do not eat more than 4 egg yolks a week. Try to eat at least 2 vegetarian meals a week. Eat more home-cooked food  and less restaurant, buffet, and fast food. Lifestyle When eating at a restaurant, ask that your food be prepared with less salt or no salt, if possible. If you drink alcohol: Limit how much you use to: 0-1 drink a day for women who are not pregnant. 0-2 drinks a day for men. Be aware of how much alcohol is in your drink. In the [place] one drink equals one 12 oz bottle of beer (355 mL), one 5 oz glass  of wine (148 mL), or one 1 oz glass of hard liquor (44 mL). General information Avoid eating more than 2,300 mg of salt a day. If you have hypertension, you may need to reduce your sodium intake to 1,500 mg a day. Work with your health care provider to maintain a healthy body weight or to lose weight. Ask what an ideal weight is for you. Get at least 30 minutes of exercise that causes your heart to beat faster (aerobic exercise) most days of the week. Activities may include walking, swimming, or biking. Work with your health care provider or dietitian to adjust your eating plan to your individual calorie needs. What foods should I eat? Fruits All fresh, dried, or frozen fruit. Canned fruit in natural juice (without added sugar). Vegetables Fresh or frozen vegetables (raw, steamed, roasted, or grilled). Low-sodium or reduced-sodium tomato and vegetable juice. Low-sodium or reduced-sodium tomato sauce and tomato paste. Low-sodium or reduced-sodium canned vegetables. Grains Whole-grain or whole-wheat bread. Whole-grain or whole-wheat pasta. Brown rice. Orpah Cobb. Bulgur. Whole-grain and low-sodium cereals. Pita bread. Low-fat, low-sodium crackers. Whole-wheat flour tortillas. Meats and other proteins Skinless chicken or [place]. Ground chicken or [place]. Pork with fat trimmed off. Fish and seafood. Egg whites. Dried beans, peas, or lentils. Unsalted nuts, nut butters, and seeds. Unsalted canned beans. Lean cuts of beef with fat trimmed off. Low-sodium, lean precooked or cured meat, such as sausages or meat loaves. Dairy Low-fat (1%) or fat-free (skim) milk. Reduced-fat, low-fat, or fat-free cheeses. Nonfat, low-sodium ricotta or cottage cheese. Low-fat or nonfat yogurt. Low-fat, low-sodium cheese. Fats and oils Soft margarine without trans fats. Vegetable oil. Reduced-fat, low-fat, or light mayonnaise and salad dressings (reduced-sodium). Canola, safflower, olive, avocado, soybean, and sunflower  oils. Avocado. Seasonings and condiments Herbs. Spices. Seasoning mixes without salt. Other foods Unsalted popcorn and pretzels. Fat-free sweets. The items listed above may not be a complete list of foods and beverages you can eat. Contact a dietitian for more information. What foods should I avoid? Fruits Canned fruit in a light or heavy syrup. Fried fruit. Fruit in cream or butter sauce. Vegetables Creamed or fried vegetables. Vegetables in a cheese sauce. Regular canned vegetables (not low-sodium or reduced-sodium). Regular canned tomato sauce and paste (not low-sodium or reduced-sodium). Regular tomato and vegetable juice (not low-sodium or reduced-sodium). Rosita Fire. Olives. Grains Baked goods made with fat, such as croissants, muffins, or some breads. Dry pasta or rice meal packs. Meats and other proteins Fatty cuts of meat. Ribs. Fried meat. Tomasa Blase. Bologna, salami, and other precooked or cured meats, such as sausages or meat loaves. Fat from the back of a pig (fatback). Bratwurst. Salted nuts and seeds. Canned beans with added salt. Canned or smoked fish. Whole eggs or egg yolks. Chicken or [place] with skin. Dairy Whole or 2% milk, cream, and half-and-half. Whole or full-fat cream cheese. Whole-fat or sweetened yogurt. Full-fat cheese. Nondairy creamers. Whipped toppings. Processed cheese and cheese spreads. Fats and oils Butter. Stick margarine. Lard. Shortening. Ghee. Bacon fat. Tropical oils, such  as coconut, palm kernel, or palm oil. Seasonings and condiments Onion salt, garlic salt, seasoned salt, table salt, and sea salt. Worcestershire sauce. Tartar sauce. Barbecue sauce. Teriyaki sauce. Soy sauce, including reduced-sodium. Steak sauce. Canned and packaged gravies. Fish sauce. Oyster sauce. Cocktail sauce. Store-bought horseradish. Ketchup. Mustard. Meat flavorings and tenderizers. Bouillon cubes. Hot sauces. Pre-made or packaged marinades. Pre-made or packaged taco seasonings.  Relishes. Regular salad dressings. Other foods Salted popcorn and pretzels. The items listed above may not be a complete list of foods and beverages you should avoid. Contact a dietitian for more information. Where to find more information National Heart, Lung, and Blood Institute: PopSteam.is American Heart Association: www.heart.org Academy of Nutrition and Dietetics: www.eatright.org National Kidney Foundation: www.kidney.org Summary The DASH eating plan is a healthy eating plan that has been shown to reduce high blood pressure (hypertension). It may also reduce your risk for type 2 diabetes, heart disease, and stroke. When on the DASH eating plan, aim to eat more fresh fruits and vegetables, whole grains, lean proteins, low-fat dairy, and heart-healthy fats. With the DASH eating plan, you should limit salt (sodium) intake to 2,300 mg a day. If you have hypertension, you may need to reduce your sodium intake to 1,500 mg a day. Work with your health care provider or dietitian to adjust your eating plan to your individual calorie needs. This information is not intended to replace advice given to you by your health care provider. Make sure you discuss any questions you have with your health care provider. Document Revised: 05/22/2019 Document Reviewed: 05/22/2019 Elsevier Patient Education  2022 [employer].

## 2021-06-20 NOTE — Progress Notes (Signed)
Subjective:    Patient ID: Ronald Sweeney, male    DOB: June 03, 1966, 55 y.o.   MRN: [id number]  Ronald Sweeney is a 55 y.o. male presenting on 06/20/2021 for Hypertension   HPI   CHRONIC HTN: Last visit 09/2020 for same problem, HTN, he has been managed on Amlodipine in past year, previously back in 2019 had been on Lisinopril with ACEi cough, swapped to Losartan HCTZ  he had side effect, and ultimately changed to Amlodipine - He has history of anxiety influencing his blood pressure. Multiple stressors, now these have improved, work related stress improved w/ new job He has had some issue with adherence to blood pressure med in past, but now back on med. He has complication with blood in urine and followed w/ Urology and has had kidney stones. These issues are also now resolved Current Meds - Amlodipine 10mg  daily Denies CP, dyspnea, HA, edema, dizziness / lightheadedness    Depression screen [facility] 2/9 10/12/2016 12/09/2015  Decreased Interest 0 0  Down, Depressed, Hopeless 0 0  PHQ - 2 Score 0 0    Social History   Tobacco Use   Smoking status: Never   Smokeless tobacco: Never  Substance Use Topics   Alcohol use: Yes    Alcohol/week: 0.0 standard drinks    Comment: occasional   Drug use: No    Review of Systems Per HPI unless specifically indicated above     Objective:    BP (!) 154/90 (BP Location: Left Arm, Cuff Size: Normal)    Pulse 75    Ht 5\' 9"  (1.753 m)    Wt 189 lb 9.6 oz (86 kg)    SpO2 100%    BMI 28.00 kg/m   Wt Readings from Last 3 Encounters:  06/20/21 189 lb 9.6 oz (86 kg)  03/28/21 188 lb (85.3 kg)  02/06/21 188 lb (85.3 kg)    Physical Exam Vitals and nursing note reviewed.  Constitutional:      General: He is not in acute distress.    Appearance: Normal appearance. He is well-developed. He is not diaphoretic.     Comments: Well-appearing, comfortable, cooperative  HENT:     Head: Normocephalic and atraumatic.  Eyes:     General:         Right eye: No discharge.        Left eye: No discharge.     Conjunctiva/sclera: Conjunctivae normal.  Cardiovascular:     Rate and Rhythm: Normal rate.  Pulmonary:     Effort: Pulmonary effort is normal.  Skin:    General: Skin is warm and dry.     Findings: No erythema or rash.  Neurological:     Mental Status: He is alert and oriented to person, place, and time.  Psychiatric:        Mood and Affect: Mood normal.        Behavior: Behavior normal.        Thought Content: Thought content normal.     Comments: Well groomed, good eye contact, normal speech and thoughts     Results for orders placed or performed in visit on 02/23/21  Microscopic Examination   Urine  Result Value Ref Range   WBC, UA 6-10 (A) 0 - 5 /hpf   RBC >30 (A) 0 - 2 /hpf   Epithelial Cells (non renal) 0-10 0 - 10 /hpf   Bacteria, UA None seen None seen/Few  Urinalysis, Complete  Result Value Ref Range  Specific Gravity, UA 1.015 1.005 - 1.030   pH, UA 7.0 5.0 - 7.5   Color, UA Yellow Yellow   Appearance Ur Cloudy (A) Clear   Leukocytes,UA 1+ (A) Negative   Protein,UA 2+ (A) Negative/Trace   Glucose, UA Negative Negative   Ketones, UA Trace (A) Negative   RBC, UA 2+ (A) Negative   Bilirubin, UA Negative Negative   Urobilinogen, Ur 0.2 0.2 - 1.0 mg/dL   Nitrite, UA Negative Negative   Microscopic Examination See below:       Assessment & Plan:   Problem List Items Addressed This Visit     Essential hypertension    Still Uncontrolled HTN Multiple readings confirmed Home BP readings Failed Lisinopril ACEi-Cough, HCTZ/Losartan   Plan:  1. Continue Amlodipine 10mg  daily, not having side effect or swelling. - Discuss goal to add 2nd agent in future - consider Spiro, BB, or Thiazide again. In future - he is very hesitant about side effects 2. Encourage improved lifestyle - low sodium diet, regular exercise 3. Monitor BP at home, if BP >140/90 return sooner we can further manage it. - Discuss  possible secondary causes consider sleep apnea      Relevant Medications   amLODipine (NORVASC) 10 MG tablet      Meds ordered this encounter  Medications   amLODipine (NORVASC) 10 MG tablet    Sig: Take 1 tablet (10 mg total) by mouth daily.    Dispense:  90 tablet    Refill:  3    Update previous rx      Follow up plan: Return in about 6 months (around 12/19/2021) for 6 month follow-up HTN.  12/21/2021, DO [facility] 06/20/2021, 4:28 PM

## 2021-06-21 NOTE — Assessment & Plan Note (Signed)
Still Uncontrolled HTN Multiple readings confirmed Home BP readings Failed Lisinopril ACEi-Cough, HCTZ/Losartan   Plan:  1. Continue Amlodipine 10mg  daily, not having side effect or swelling. - Discuss goal to add 2nd agent in future - consider Spiro, BB, or Thiazide again. In future - he is very hesitant about side effects 2. Encourage improved lifestyle - low sodium diet, regular exercise 3. Monitor BP at home, if BP >140/90 return sooner we can further manage it. - Discuss possible secondary causes consider sleep apnea

## 2021-07-25 ENCOUNTER — Other Ambulatory Visit: Payer: Self-pay | Admitting: Family Medicine

## 2021-07-25 DIAGNOSIS — I10 Essential (primary) hypertension: Secondary | ICD-10-CM

## 2021-07-25 NOTE — Telephone Encounter (Signed)
Requested medication (s) are due for refill today:   Yes  Requested medication (s) are on the active medication list:   Yes  Future visit scheduled:   No.   Seen a mo. ago   Last ordered: 06/20/2021 #90, 3 refills  Returned because a 90 day supply is being requested.   Requested Prescriptions  Pending Prescriptions Disp Refills   amLODipine (NORVASC) 10 MG tablet [Pharmacy Med Name: AMLODIPINE BESYLATE 10MG  TABLETS] 90 tablet 3    Sig: TAKE 1 TABLET(10 MG) BY MOUTH DAILY     Cardiovascular:  Calcium Channel Blockers Failed - 07/25/2021  9:14 AM      Failed - Last BP in normal range    BP Readings from Last 1 Encounters:  06/20/21 (!) 154/90          Passed - Valid encounter within last 6 months    Recent Outpatient Visits           1 month ago Essential hypertension   [place], DO   9 months ago Essential hypertension   [place], DO   9 months ago Gross hematuria   [place], DO   3 years ago ACE-inhibitor cough   [place]arr, Devonne Doughty, DO   3 years ago Essential hypertension   Jud, Jerrel Ivory, NP       Future Appointments             In 1 month Hollice Espy, MD [place]

## 2021-09-20 ENCOUNTER — Ambulatory Visit: Payer: No Typology Code available for payment source | Admitting: Urology

## 2021-09-26 ENCOUNTER — Ambulatory Visit: Payer: No Typology Code available for payment source | Admitting: Urology

## 2021-10-03 NOTE — Progress Notes (Signed)
? ?10/04/21 ?8:08 AM  ? ?Ronald Sweeney ?16-Jul-1965 ?[id number] ? ?Referring provider:  ?Olin Hauser, DO ?[address] ?[place],  [place] [address] ?No chief complaint on file. ? ? ?HPI: ?Ronald Sweeney is a 56 y.o.male with a personal history of incidental left adrenal nodule, and nephrolithiasis, who presents today for a 6 month follow-up with KUB.  ? ?He is s/p ESWL with Dr.Stoioff on 12/01/2020 for management or a 8 mm proximal left ureteral stone.  ?  ?01/13/2021 DG abdomen showed the stone overlying the left ureter is unchanged in position and continues to measure 9 mm. No other changes. ?  ?He is s/p ureteroscopy on 02/06/2021 . Stone was remarkably impacted the stent was eft for 2 weeks given the degree of impaction. ?  ?03/23/2021 RUS revealed no hydronephrosis or obstructive uropathy, 8 mm nonobstructive left renal nephrolithiasis, simple renal cysts measuring up to 1.0 cm, and enlarged prostate.  ? ?KUB today was personally reviewed and interpreted showed punctate LLP stone. ? ?He denies any interval hematuria or flank pain.  Drinking plenty of water.  Doing well.   ? ?PMH: ?Past Medical History:  ?Diagnosis Date  ? Allergy   ? seasonal  ? History of kidney stones   ? Hypertension   ? ? ?Surgical History: ?Past Surgical History:  ?Procedure Laterality Date  ? COLONOSCOPY WITH PROPOFOL N/A 11/29/2016  ? Procedure: COLONOSCOPY WITH PROPOFOL;  Surgeon: Lucilla Lame, MD;  Location: [facility];  Service: Endoscopy;  Laterality: N/A;  ? CYSTOSCOPY/URETEROSCOPY/HOLMIUM LASER/STENT PLACEMENT Left 02/06/2021  ? Procedure: CYSTOSCOPY/URETEROSCOPY/HOLMIUM LASER/STENT PLACEMENT;  Surgeon: Hollice Espy, MD;  Location: [facility];  Service: Urology;  Laterality: Left;  ? EXTRACORPOREAL SHOCK WAVE LITHOTRIPSY Left 12/01/2020  ? Procedure: EXTRACORPOREAL SHOCK WAVE LITHOTRIPSY (ESWL);  Surgeon: Abbie Sons, MD;  Location: [facility];  Service: Urology;  Laterality: Left;  ? ? ?Home Medications:   ?Allergies as of 10/04/2021   ? ?   Reactions  ? Shellfish Allergy Shortness Of Breath, Swelling  ? ?  ? ?  ?Medication List  ?  ? ?  ? Accurate as of October 04, 2021 11:59 PM. If you have any questions, ask your nurse or doctor.  ?  ?  ? ?  ? ?amLODipine 10 MG tablet ?Commonly known as: NORVASC ?TAKE 1 TABLET(10 MG) BY MOUTH DAILY ?  ?multivitamin with minerals tablet ?Take 1 tablet by mouth daily. ?  ? ?  ? ? ?Allergies:  ?Allergies  ?Allergen Reactions  ? Shellfish Allergy Shortness Of Breath and Swelling  ? ? ?Family History: ?Family History  ?Problem Relation Age of Onset  ? Cancer Mother   ?     colon cancer  ? Colon polyps Mother   ? ? ?Social History:  reports that he has never smoked. He has never used smokeless tobacco. He reports current alcohol use. He reports that he does not use drugs. ? ? ?Physical Exam: ?BP (!) 184/99   Pulse 86   Ht 5\' 9"  (1.753 m)   Wt 189 lb (85.7 kg)   BMI 27.91 kg/m?   ?Constitutional:  Alert and oriented, No acute distress. ?HEENT: [place] AT, moist mucus membranes.  Trachea midline, no masses. ?Cardiovascular: No clubbing, cyanosis, or edema. ?Respiratory: Normal respiratory effort, no increased work of breathing. ?Skin: No rashes, bruises or suspicious lesions. ?Neurologic: Grossly intact, no focal deficits, moving all 4 extremities. ?Psychiatric: Normal mood and affect. ? ?Laboratory Data: ?Lab Results  ?Component Value Date  ?  CREATININE 1.40 (H) 02/03/2021  ? ?Lab Results  ?Component Value Date  ? HGBA1C 5.5 10/12/2016  ? ? ?Pertinent Imaging: ?KUB was personally reviewed and interpreted today see HPI, final read pending ? ? ?Assessment & Plan:   ? ?History of stones  ? - RUS showed stone in left lower pole, punctate, recommend obs ?- Will monitor with KUB in 1 year  or sooner as needed ?-reminded on importance of stone diet ? ? ?Conley Rolls as a scribe for Hollice Espy, MD.,have documented all relevant documentation on the behalf of Hollice Espy, MD,as  directed by  Hollice Espy, MD while in the presence of Hollice Espy, MD. ? ?I have reviewed the above documentation for accuracy and completeness, and I agree with the above.  ? ?Hollice Espy, MD ? ? ?[facility] ?[address] ?[place], [place] [address] ?[phone] ? ?

## 2021-10-04 ENCOUNTER — Encounter: Payer: Self-pay | Admitting: Urology

## 2021-10-04 ENCOUNTER — Ambulatory Visit (INDEPENDENT_AMBULATORY_CARE_PROVIDER_SITE_OTHER): Payer: BC Managed Care – PPO | Admitting: Urology

## 2021-10-04 ENCOUNTER — Ambulatory Visit
Admission: RE | Admit: 2021-10-04 | Discharge: 2021-10-04 | Disposition: A | Discharge status: Home / Self Care | Payer: BC Managed Care – PPO | Source: Ambulatory Visit | Attending: Urology | Admitting: Urology

## 2021-10-04 VITALS — BP 184/99 | HR 86 | Ht 69.0 in | Wt 189.0 lb

## 2021-10-04 DIAGNOSIS — N201 Calculus of ureter: Secondary | ICD-10-CM | POA: Diagnosis not present

## 2021-10-30 ENCOUNTER — Telehealth: Payer: Self-pay | Admitting: Pharmacist

## 2021-10-30 NOTE — Telephone Encounter (Signed)
?  Chronic Care Management  ? ?Outreach Note ? ?10/30/2021 ?Name: Ronald Sweeney MRN: [id number] DOB: 1966-06-21 ? ? ?Patient appearing on report for [employer] [place] [employer] Hypertension Control due to last documented ambulatory blood pressure of 184/99 on 10/04/2021. Next appointment with PCP is not yet scheduled. ? ?Was unable to reach patient via telephone today. Leave message with patient's wife (listed on patient's DPR in chart) requesting call back. ? ? ?Follow Up Plan: CM Pharmacist will outreach to patient by telephone again within the next 30 days ? ?Estelle Grumbles, PharmD, BCACP ?Clinical Pharmacist ?[facility] ?[place] ?[phone] ? ?

## 2021-11-22 ENCOUNTER — Telehealth: Payer: Self-pay | Admitting: Pharmacist

## 2021-11-22 NOTE — Telephone Encounter (Signed)
  Chronic Care Management    Outreach Note   10/30/2021 Name: Ronald Sweeney    MRN: [id number]       DOB: Jul 24, 1965     Patient appearing on report for [employer] [place] Metric Hypertension Control due to last documented ambulatory blood pressure of 184/99 on 10/04/2021. Next appointment with PCP is not yet scheduled.   Was unable to reach patient via telephone today. Leave message with patient's wife (listed on patient's DPR in chart) requesting call back. Outreach attempt #2     Follow Up Plan: CM Pharmacist will outreach to patient by telephone again within the next 30 days   Estelle Grumbles, PharmD, [employer] Clinical Pharmacist [facility] [phone]

## 2021-11-29 ENCOUNTER — Ambulatory Visit: Payer: BC Managed Care – PPO | Admitting: Pharmacist

## 2021-11-29 DIAGNOSIS — I10 Essential (primary) hypertension: Secondary | ICD-10-CM

## 2021-11-29 NOTE — Chronic Care Management (AMB) (Signed)
  Chronic Care Management   Outreach Note  11/29/2021 Name: KORVER GRAYBEAL MRN: [id number] DOB: May 21, 1966  I connected with Jami Marnette Burgess on 11/29/21 by telephone outreach and verified that I am speaking with the correct person using two identifiers.  Patient appearing on report for [employer] [place] Metric Hypertension Control due to last documented ambulatory blood pressure of 184/99 on 10/04/2021. Next appointment with PCP is not yet scheduled.  Outreached patient to discuss hypertension control and medication management. Receive call back from patient today  Outpatient Encounter Medications as of 11/29/2021  Medication Sig   amLODipine (NORVASC) 10 MG tablet TAKE 1 TABLET(10 MG) BY MOUTH DAILY   Multiple Vitamins-Minerals (MULTIVITAMIN WITH MINERALS) tablet Take 1 tablet by mouth daily.   No facility-administered encounter medications on file as of 11/29/2021.    Lab Results  Component Value Date   CREATININE 1.40 (H) 02/03/2021   BUN 18 02/03/2021   NA 141 02/03/2021   K 3.7 02/03/2021   CL 103 02/03/2021   CO2 29 02/03/2021    BP Readings from Last 3 Encounters:  10/04/21 (!) 184/99  06/20/21 (!) 154/90  03/28/21 (!) 181/102    Pulse Readings from Last 3 Encounters:  10/04/21 86  06/20/21 75  03/28/21 94    Current medications: amlodipine 10 mg daily Reports misses a dose <once/week Reports keeps his pill bottle next to his keys to help him remember to take his medication each morning prior to leaving Encourage patient to consider using daily phone alarm as adherence aid  Home Monitoring: Patient does not have an automated upper arm home BP machine Encourage patient to obtain an upper arm blood pressure monitor Counsel on BP monitoring technique  Current physical activity: spin bike for 45 minutes and weight lift 15 minutes and jogs 4-5 miles (45-50 minutes) x 5-6 days/week  Reports working on weight loss, current home weight ~183 lbs  Caffeine: 12 oz cup of  decaffeinated, but find recently sometimes caffeinated, coffee each morning Will now go back to just decaffeinated coffee  Reports working on reducing salt/sodium intake - avoids adding salt to his food Reports has started watching nutrition labels for sodium content Reducing deli meat, pickles and other high sodium food  Assessment/Plan: - Currently uncontrolled based on latest in office readings - Reviewed goal blood pressure <140/90 - Reviewed appropriate administration of medication regimen - Counseled on long term microvascular and macrovascular complications of uncontrolled hypertension - Reviewed appropriate home BP monitoring technique (avoid caffeine, smoking, and exercise for 30 minutes before checking, rest for at least 5 minutes before taking BP, sit with feet flat on the floor and back against a hard surface, uncross legs, and rest arm on flat surface) - Reviewed to check blood pressure daily, document, and provide at next provider visit - Will mail patient blood pressure log and handout on blood pressure monitoring technique as requested - Discussed dietary modifications, such as reduced salt and sodium intake, focus on whole grains, vegetables, lean proteins - Reviewed strategies to improve medication adherence such as use of daily phone alarm - Recommend patient contact office to schedule 81-month follow up appointment with PCP   Follow Up Plan: CM Pharmacist will outreach to patient by telephone again on 6/21 at 3:15 pm  Estelle Grumbles, PharmD, [place] [place], CPP Clinical Pharmacist [facility] [phone]

## 2021-11-29 NOTE — Patient Instructions (Signed)
Check your blood pressure once daily, and any time you have concerning symptoms like headache, chest pain, dizziness, shortness of breath, or vision changes.   Our goal is less than 140/90.  To appropriately check your blood pressure, make sure you do the following:  1) Avoid caffeine, exercise, or tobacco products for 30 minutes before checking. Empty your bladder. 2) Sit with your back supported in a flat-backed chair. Rest your arm on something flat (arm of the chair, table, etc). 3) Sit still with your feet flat on the floor, resting, for at least 5 minutes.  4) Check your blood pressure. Take 1-2 readings.  5) Write down these readings and bring with you to any provider appointments.  Bring your home blood pressure machine with you to a provider's office for accuracy comparison at least once a year.   Make sure you take your blood pressure medications before you come to any office visit, even if you were asked to fast for labs.  Ollin Hochmuth Kimble Hitchens, PharmD, BCACP, CPP Clinical Pharmacist [facility] [phone]  

## 2021-12-20 ENCOUNTER — Telehealth: Payer: Self-pay | Admitting: Pharmacist

## 2021-12-20 ENCOUNTER — Telehealth: Payer: BC Managed Care – PPO

## 2021-12-20 NOTE — Telephone Encounter (Signed)
  Chronic Care Management   Outreach Note  12/20/2021 Name: Ronald Sweeney MRN: [id number] DOB: [birth year]   Patient appearing on report for [employer] [place] Metric Hypertension Control due to last documented ambulatory blood pressure of 184/99 on 10/04/2021. Next appointment with PCP is not yet scheduled.  Outreached patient to discuss hypertension control and medication management. Was unable to reach patient via telephone today and have left HIPAA compliant voicemail asking patient to return my call.      Follow Up Plan: CM Pharmacist will outreach to patient by telephone again within the next 30 days  Estelle Grumbles, PharmD, [facility] Clinical Pharmacist [facility] [phone]

## 2021-12-27 ENCOUNTER — Ambulatory Visit: Payer: BC Managed Care – PPO | Admitting: Pharmacist

## 2021-12-27 DIAGNOSIS — I10 Essential (primary) hypertension: Secondary | ICD-10-CM

## 2021-12-27 NOTE — Chronic Care Management (AMB) (Signed)
  Chronic Care Management   Outreach Note  12/27/2021 Name: Ronald Sweeney MRN: [id number] DOB: September 13, 1965  I connected with Ronald Sweeney on 12/27/21 by telephone outreach and verified that I am speaking with the correct person using two identifiers.  Patient appearing on report for [employer] [place] Metric Hypertension Control due to last documented ambulatory blood pressure of 184/99 on 10/04/2021. Next appointment with PCP is not yet scheduled.  Outreached patient to discuss hypertension control and medication management.   Outpatient Encounter Medications as of 12/27/2021  Medication Sig   amLODipine (NORVASC) 10 MG tablet TAKE 1 TABLET(10 MG) BY MOUTH DAILY   Multiple Vitamins-Minerals (MULTIVITAMIN WITH MINERALS) tablet Take 1 tablet by mouth daily.   No facility-administered encounter medications on file as of 12/27/2021.    Lab Results  Component Value Date   CREATININE 1.40 (H) 02/03/2021   BUN 18 02/03/2021   NA 141 02/03/2021   K 3.7 02/03/2021   CL 103 02/03/2021   CO2 29 02/03/2021    BP Readings from Last 3 Encounters:  10/04/21 (!) 184/99  06/20/21 (!) 154/90  03/28/21 (!) 181/102    Pulse Readings from Last 3 Encounters:  10/04/21 86  06/20/21 75  03/28/21 94    Current medications: amlodipine 10 mg daily Denies missed doses over past month   Home Monitoring: Patient obtained and started using an automated upper arm home BP machine Reports recent home blood pressure readings:  Blood Pressure Notes  21 - June 162/97   22 - June 152/92   23 - June 150/90   24 - June -   25 - June 161/96, HR 88* *Higher sodium meal  26 - June -   27 - June 148/94, HR 78   28 - June 151/95, HR 87    Counsel on BP monitoring technique   Current physical activity: spin bike for 45 minutes and weight lift 15 minutes and jogs 4-5 miles (45-50 minutes) x 5-6 days/week   Caffeine: previously drinking decaffeinated coffee, but reports has recently cut out coffee    Reports making significant dietary changes to reduce salt/sodium intake, including watching nutrition labels for sodium content and avoiding deli meat, pickles and other high sodium food   Assessment/Plan: - Currently uncontrolled - Reviewed goal blood pressure <140/90 - Counseled on long term microvascular and macrovascular complications of uncontrolled hypertension - Reviewed appropriate home BP monitoring technique (avoid caffeine, smoking, and exercise for 30 minutes before checking, rest for at least 5 minutes before taking BP, sit with feet flat on the floor and back against a hard surface, uncross legs, and rest arm on flat surface) - Reviewed to check blood pressure daily, document (including heart rate), and provide at next provider visit - Discussed dietary modifications, such as reduced salt intake, focus on whole grains, vegetables, lean proteins - Recommend patient contact office to schedule 27-month follow up appointment with PCP  Patient states he/his wife will call this week to schedule   Follow Up Plan: CM Pharmacist will outreach to patient by telephone again on 01/31/2022 at 3:30 PM  Estelle Grumbles, PharmD, [facility] Clinical Pharmacist [facility] [phone]

## 2021-12-27 NOTE — Patient Instructions (Signed)
Check your blood pressure once daily, and any time you have concerning symptoms like headache, chest pain, dizziness, shortness of breath, or vision changes.   Our goal is less than 140/90.  To appropriately check your blood pressure, make sure you do the following:  1) Avoid caffeine, exercise, or tobacco products for 30 minutes before checking. Empty your bladder. 2) Sit with your back supported in a flat-backed chair. Rest your arm on something flat (arm of the chair, table, etc). 3) Sit still with your feet flat on the floor, resting, for at least 5 minutes.  4) Check your blood pressure. Take 1-2 readings.  5) Write down these readings and bring with you to any provider appointments.  Bring your home blood pressure machine with you to a provider's office for accuracy comparison at least once a year.   Make sure you take your blood pressure medications before you come to any office visit, even if you were asked to fast for labs.  Axten Pascucci Shadrack Brummitt, PharmD, BCACP Clinical Pharmacist [facility] [phone]  

## 2022-01-10 ENCOUNTER — Encounter: Payer: Self-pay | Admitting: Family Medicine

## 2022-01-10 ENCOUNTER — Other Ambulatory Visit: Payer: Self-pay | Admitting: Family Medicine

## 2022-01-10 ENCOUNTER — Ambulatory Visit: Payer: BC Managed Care – PPO | Admitting: Family Medicine

## 2022-01-10 VITALS — BP 154/88 | HR 91 | Ht 69.0 in | Wt 183.0 lb

## 2022-01-10 DIAGNOSIS — Z125 Encounter for screening for malignant neoplasm of prostate: Secondary | ICD-10-CM

## 2022-01-10 DIAGNOSIS — I10 Essential (primary) hypertension: Secondary | ICD-10-CM

## 2022-01-10 DIAGNOSIS — Z0000 Encounter for general adult medical examination without abnormal findings: Secondary | ICD-10-CM

## 2022-01-10 DIAGNOSIS — R7309 Other abnormal glucose: Secondary | ICD-10-CM

## 2022-01-10 NOTE — Assessment & Plan Note (Addendum)
Still Uncontrolled HTN, some improvement in home readings lately Calibrate home electronic cuff is similar to our electronic cuff Manual reading still lowest Failed Lisinopril ACEi-Cough, HCTZ/Losartan - uncertain why    Plan:  1. Continue Amlodipine 10mg  daily, not having side effect or swelling. - Discuss goal to add 2nd agent in near future - I have discussed this further with CCM Pharmacist today, ultimately we agree that need to determine the issue with ARB / Thiazide meds first and if either is a viable option as 2nd agent add on to amlodipine. If we could use one of these that would be preferred. If not then looking at consider Beta Blocker as add on, reviewed all benefits risks with patient of these med classes. He is concerned about side effects - Note I attempted to call patient back could not reach him, and sent a detailed mychart message to get more input from him, once we collaborate and confirm which med to use next, will send order for 30 day course  Return in 4 weeks for labs / physical  2. Encourage improved lifestyle - low sodium diet, regular exercise - keep up the great work w low sodium and lifestyle exercise  3. Monitor BP at home, initial goal < 140/90, eventual goal is < 135/85  If we can pursue 2nd agent for BP management, and if he has tried 3-4+ medications overall and still limited success, would pursue further 2ndary causes or referral to adv HTN clinic in future.

## 2022-01-10 NOTE — Progress Notes (Signed)
Subjective:    Patient ID: Ronald Sweeney, male    DOB: 09-23-1965, 56 y.o.   MRN: [id number]  Ronald Sweeney is a 56 y.o. male presenting on 01/10/2022 for Hypertension   HPI  CHRONIC HTN:  Follow-up today 7 months since last visit 06/2021, he has since followed more recently w/ Estelle Grumbles RPH-CPP with [facility], below is a copied BP chart from her recent note 6/28 with his home readings.    Blood Pressure Notes  21 - June 162/97    22 - June 152/92    23 - June 150/90    24 - June -    25 - June 161/96, HR 88* *Higher sodium meal  26 - June -    27 - June 148/94, HR 78    28 - June 151/95, HR 87     Additional BP readings today provided by patient.  7/1 152/89 (83) 7/3 156/93 (69) 7/5 151/91 (77) 7/6 148/86 (76) 7/7 152/92 (75) 7/8 144/87 (77) 7/9 137/85 (80) 7/11 142/85 (77) 7/12 144/90 (75) today  He continues to exercise 5-6 days a week with moderate intensity cardio and weight training. He consumes protein supplements and other natural supplements.  Today he was just working out 30 min prior to visit.  He has to be at work in AM, takes meds 530am - takes Centrum Silver 50+ mens, and BP med daily He does admit some poor sleep often. Not endorsing sleep apnea.  He has quit drinking coffee 1.5 months ago, no decaf  He limits sodium content to [phone] per day max.  He takes protein powders regularly that are low sodium He is eating bananas for potassium, increased fruits. Apples, grapes, strawberries. Beet supplement and juice Drinks mostly water  BP Readings today in office 166/79 electronic BP on cart 168/96 electronic BP from his home cuff 154/88 manual reading   He can feel his heart racing prior to BP reading with anticipation.  He is currently taking Amlodipine 10mg  daily, tolerating well without any edema or other side effect.  Past history ACEi Cough on Lisinopril No longer on Losartan + HCTZ in past he had reported side effect,  unsure precisely, he had self discontinued.  Denies CP, dyspnea, HA, edema, dizziness / lightheadedness      01/10/2022    4:09 PM 10/12/2016    9:04 AM 12/09/2015   10:17 AM  Depression screen PHQ 2/9  Decreased Interest 0 0 0  Down, Depressed, Hopeless 0 0 0  PHQ - 2 Score 0 0 0    Social History   Tobacco Use   Smoking status: Never   Smokeless tobacco: Never  Substance Use Topics   Alcohol use: Yes    Alcohol/week: 0.0 standard drinks of alcohol    Comment: occasional   Drug use: No    Review of Systems Per HPI unless specifically indicated above     Objective:    BP (!) 154/88 (BP Location: Left Arm, Cuff Size: Normal)   Pulse 91   Ht 5\' 9"  (1.753 m)   Wt 183 lb (83 kg)   SpO2 100%   BMI 27.02 kg/m   Wt Readings from Last 3 Encounters:  01/10/22 183 lb (83 kg)  10/04/21 189 lb (85.7 kg)  06/20/21 189 lb 9.6 oz (86 kg)    Physical Exam Vitals and nursing note reviewed.  Constitutional:      General: He is not in acute distress.    Appearance:  Normal appearance. He is well-developed. He is not diaphoretic.     Comments: Well-appearing, comfortable, cooperative  HENT:     Head: Normocephalic and atraumatic.  Eyes:     General:        Right eye: No discharge.        Left eye: No discharge.     Conjunctiva/sclera: Conjunctivae normal.  Cardiovascular:     Rate and Rhythm: Normal rate.  Pulmonary:     Effort: Pulmonary effort is normal.  Skin:    General: Skin is warm and dry.     Findings: No erythema or rash.  Neurological:     Mental Status: He is alert and oriented to person, place, and time.  Psychiatric:        Mood and Affect: Mood normal.        Behavior: Behavior normal.        Thought Content: Thought content normal.     Comments: Well groomed, good eye contact, normal speech and thoughts       Results for orders placed or performed in visit on 02/23/21  Microscopic Examination   Urine  Result Value Ref Range   WBC, UA 6-10 (A) 0  - 5 /hpf   RBC, Urine >30 (A) 0 - 2 /hpf   Epithelial Cells (non renal) 0-10 0 - 10 /hpf   Bacteria, UA None seen None seen/Few  Urinalysis, Complete  Result Value Ref Range   Specific Gravity, UA 1.015 1.005 - 1.030   pH, UA 7.0 5.0 - 7.5   Color, UA Yellow Yellow   Appearance Ur Cloudy (A) Clear   Leukocytes,UA 1+ (A) Negative   Protein,UA 2+ (A) Negative/Trace   Glucose, UA Negative Negative   Ketones, UA Trace (A) Negative   RBC, UA 2+ (A) Negative   Bilirubin, UA Negative Negative   Urobilinogen, Ur 0.2 0.2 - 1.0 mg/dL   Nitrite, UA Negative Negative   Microscopic Examination See below:       Assessment & Plan:   Problem List Items Addressed This Visit     Essential hypertension - Primary    Still Uncontrolled HTN, some improvement in home readings lately Calibrate home electronic cuff is similar to our electronic cuff Manual reading still lowest Failed Lisinopril ACEi-Cough, HCTZ/Losartan - uncertain why    Plan:  1. Continue Amlodipine 10mg  daily, not having side effect or swelling. - Discuss goal to add 2nd agent in near future - I have discussed this further with [facility] Pharmacist today, ultimately we agree that need to determine the issue with ARB / Thiazide meds first and if either is a viable option as 2nd agent add on to amlodipine. If we could use one of these that would be preferred. If not then looking at consider Beta Blocker as add on, reviewed all benefits risks with patient of these med classes. He is concerned about side effects - Note I attempted to call patient back could not reach him, and sent a detailed mychart message to get more input from him, once we collaborate and confirm which med to use next, will send order for 30 day course  Return in 4 weeks for labs / physical  2. Encourage improved lifestyle - low sodium diet, regular exercise - keep up the great work w low sodium and lifestyle exercise  3. Monitor BP at home, initial goal <  140/90, eventual goal is < 135/85  If we can pursue 2nd agent for BP management, and if  he has tried 3-4+ medications overall and still limited success, would pursue further 2ndary causes or referral to adv HTN clinic in future.       No orders of the defined types were placed in this encounter.  Follow up plan: Return in about 4 weeks (around 02/07/2022) for 4 week fasting lab only then 1 week later Annual Physical.  Future labs ordered  Saralyn Pilar, DO [facility] 01/10/2022, 4:00 PM

## 2022-01-10 NOTE — Patient Instructions (Addendum)
  DUE for FASTING BLOOD WORK (no food or drink after midnight before the lab appointment, only water or coffee without cream/sugar on the morning of)  SCHEDULE "Lab Only" visit in the morning at the clinic for lab draw in 4 WEEKS   - Make sure Lab Only appointment is at about 1 week before your next appointment, so that results will be available  For Lab Results, once available within 2-3 days of blood draw, you can can log in to MyChart online to view your results and a brief explanation. Also, we can discuss results at next follow-up visit.  Please schedule a Follow-up Appointment to: Return in about 4 weeks (around 02/07/2022) for 4 week fasting lab only then 1 week later Annual Physical.  If you have any other questions or concerns, please feel free to call the office or send a message through MyChart. You may also schedule an earlier appointment if necessary.  Additionally, you may be receiving a survey about your experience at our office within a few days to 1 week by e-mail or mail. We value your feedback.  Saralyn Pilar, DO [facility], [place]

## 2022-01-12 ENCOUNTER — Ambulatory Visit: Payer: BC Managed Care – PPO | Admitting: Family Medicine

## 2022-01-12 IMAGING — CR DG ABDOMEN 1V
1 series · 2 of 2 positions shown · non-contrast
Comparison: Abdominal radiograph dated 12/01/2020.

CLINICAL DATA: Two weeks status post lithotripsy 4 left-sided renal
stones.

EXAM:
ABDOMEN - 1 VIEW

[Series 1: dg abd 1 view · 0.14mm/px · 2 of 2 slices shown]
[im 1/2]
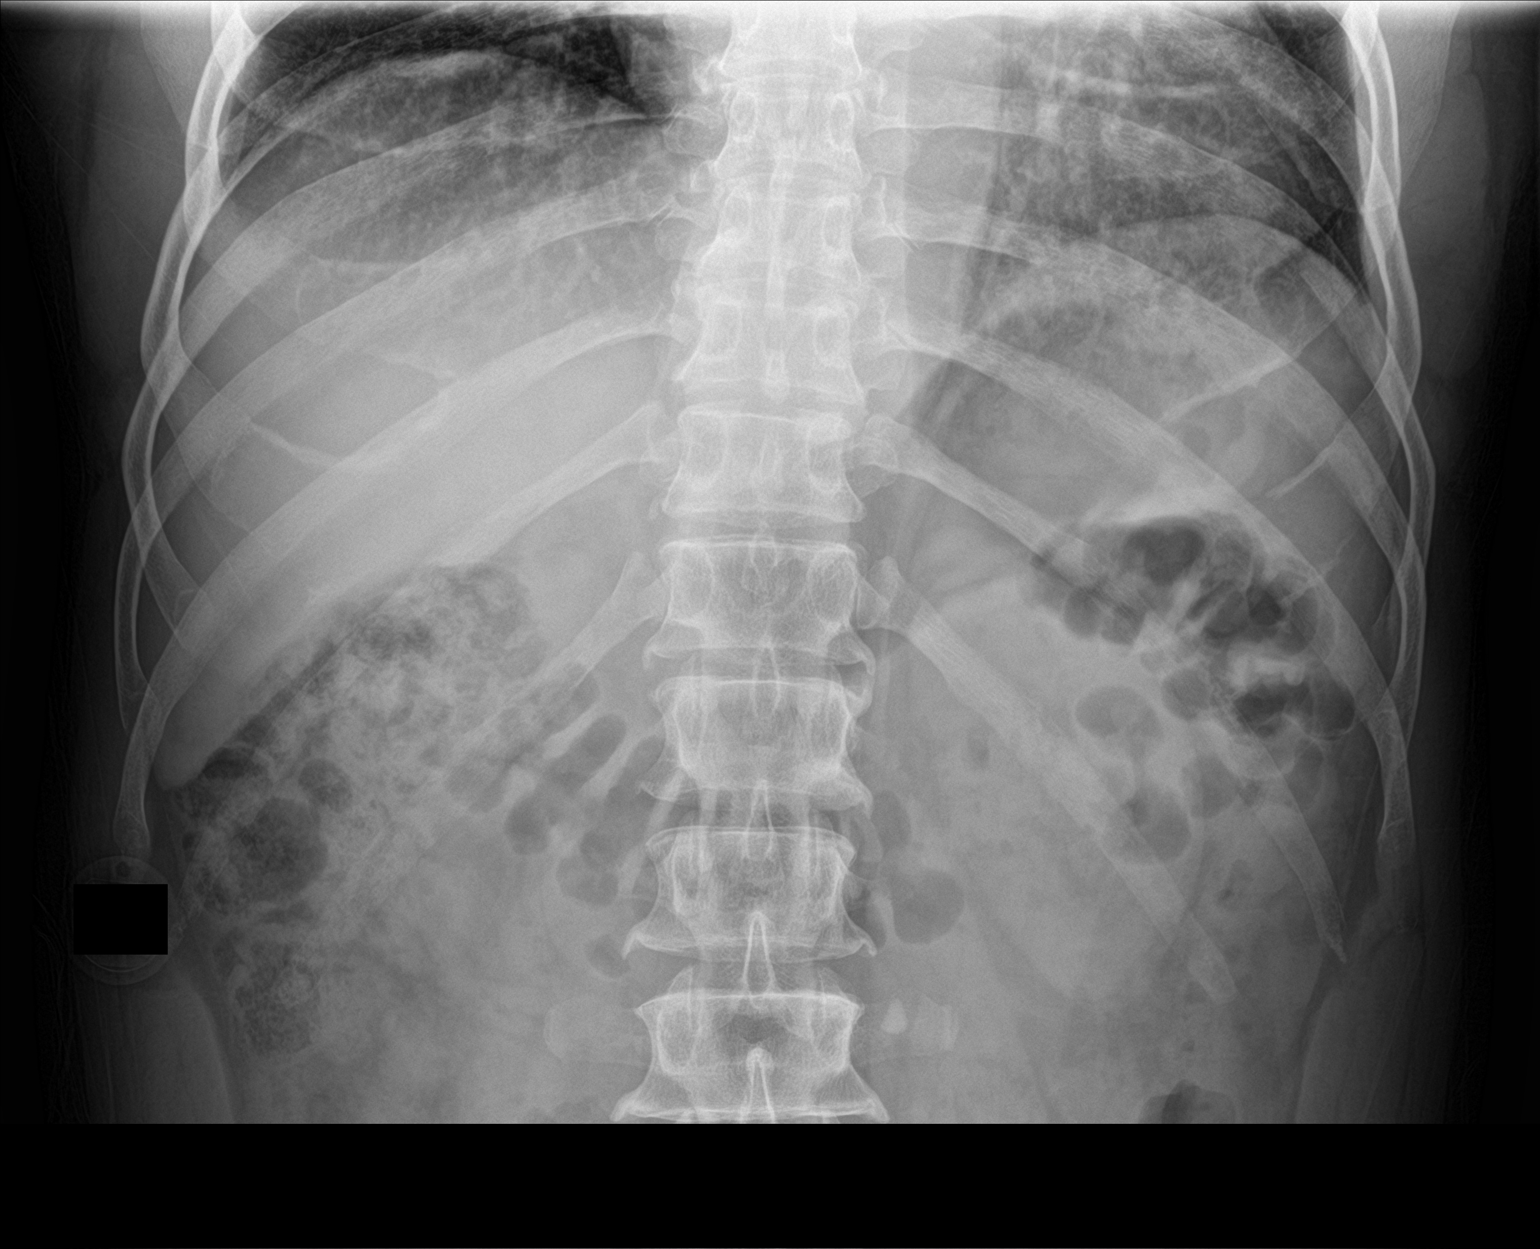
[im 2/2]
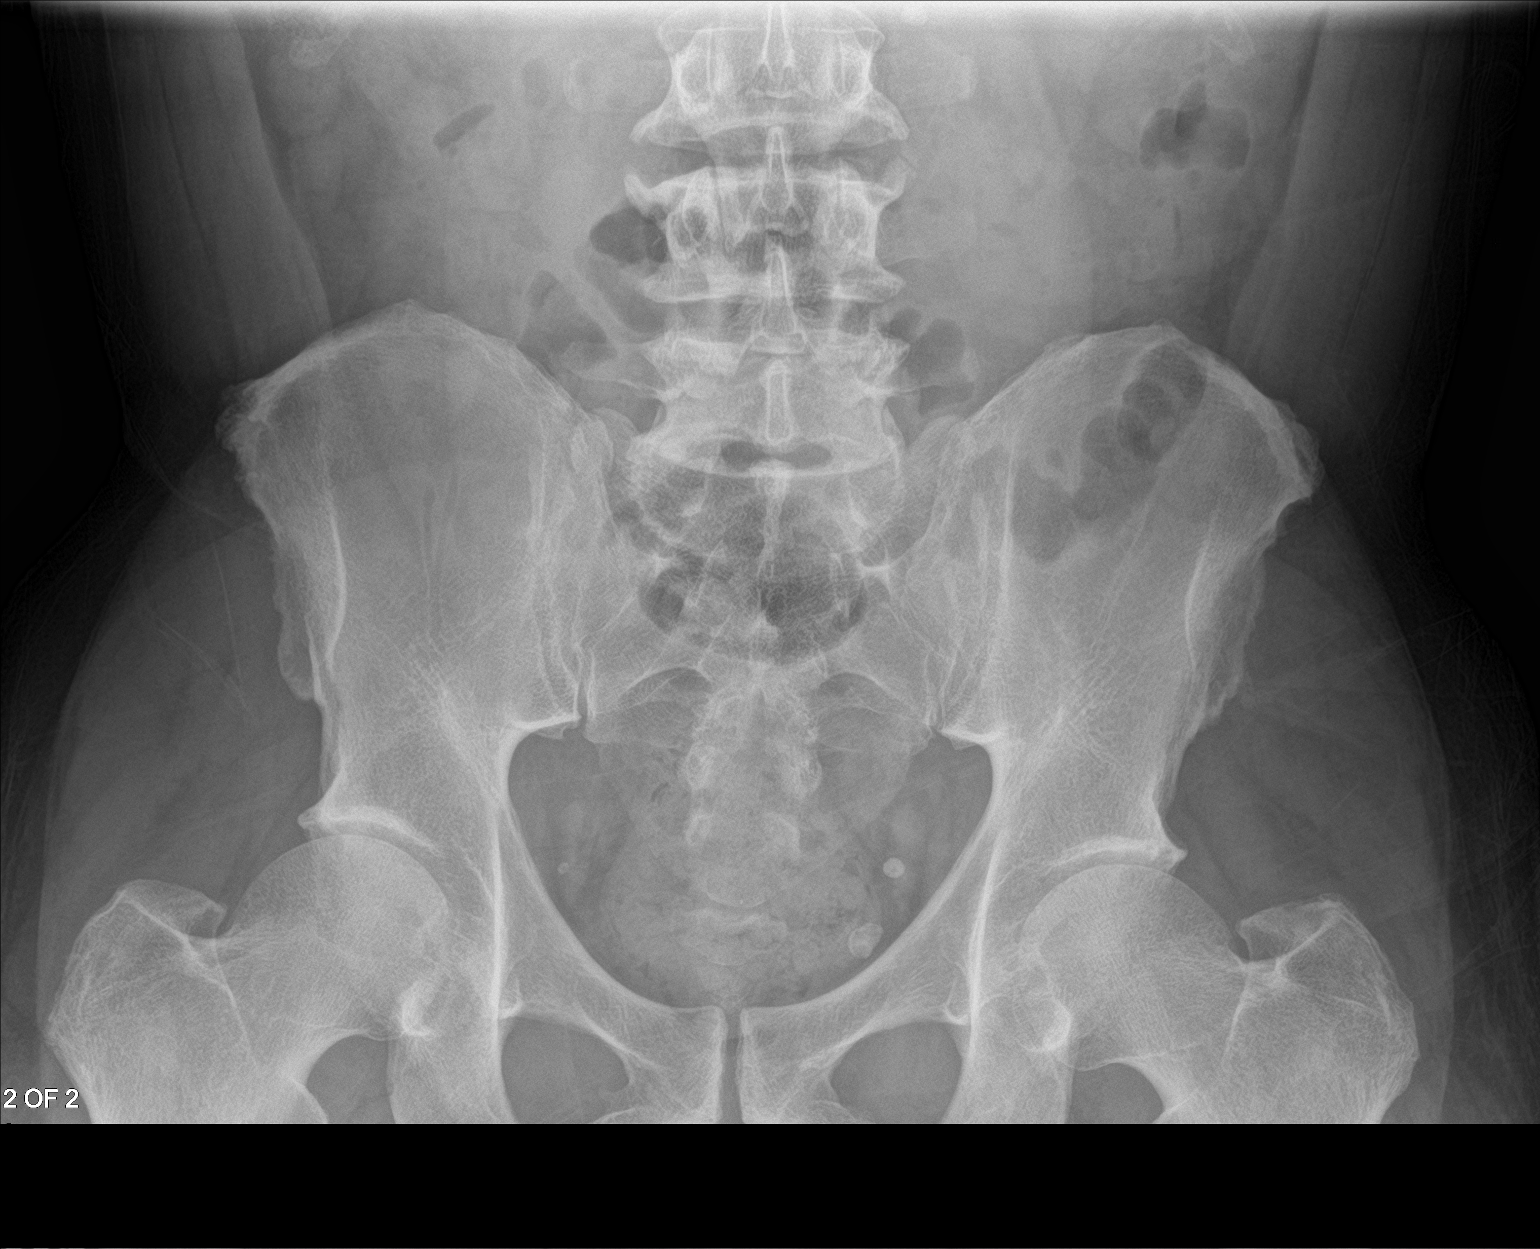

[2 of 2 positions shown; findings below may reference images not displayed]

FINDINGS: A 9 mm stone overlying the left ureter is unchanged in position
compared to prior exam. Phleboliths are noted in the pelvis,
unchanged. Stool is seen in the rectum. Mild degenerative changes
are seen in both hips and in the spine.
IMPRESSION: Stone overlying the left ureter is unchanged in position and
measures 9 mm.

## 2022-01-12 MED ORDER — VALSARTAN 80 MG PO TABS: 80.0000 mg | ORAL_TABLET | Freq: Every day | ORAL | 2 refills | Status: DC

## 2022-01-24 ENCOUNTER — Ambulatory Visit: Payer: BC Managed Care – PPO | Admitting: Pharmacist

## 2022-01-24 DIAGNOSIS — I10 Essential (primary) hypertension: Secondary | ICD-10-CM

## 2022-01-24 NOTE — Patient Instructions (Signed)
Check your blood pressure once daily, and any time you have concerning symptoms like headache, chest pain, dizziness, shortness of breath, or vision changes.   Our goal is less than 140/90.  To appropriately check your blood pressure, make sure you do the following:  1) Avoid caffeine, exercise, or tobacco products for 30 minutes before checking. Empty your bladder. 2) Sit with your back supported in a flat-backed chair. Rest your arm on something flat (arm of the chair, table, etc). 3) Sit still with your feet flat on the floor, resting, for at least 5 minutes.  4) Check your blood pressure. Take 1-2 readings.  5) Write down these readings and bring with you to any provider appointments.  Bring your home blood pressure machine with you to a provider's office for accuracy comparison at least once a year.   Make sure you take your blood pressure medications before you come to any office visit, even if you were asked to fast for labs.  Meera Vasco Anastasija Anfinson, PharmD, BCACP, CPP Clinical Pharmacist [facility] [phone]  

## 2022-01-24 NOTE — Chronic Care Management (AMB) (Signed)
  Chronic Care Management   Outreach Note  01/24/2022 Name: Ronald Sweeney MRN: [id number] DOB: September 25, 1965  I connected with Ronald Sweeney on 01/24/22 by telephone outreach and verified that I am speaking with the correct person using two identifiers.  Patient appearing on report for [place] Metric Hypertension Control due to last documented ambulatory blood pressure of 154/88 on 01/10/2022. Next appointment with PCP is not yet scheduled.  Note on 01/10/2022, provider advised patient via MyChart message to start valsartan 80mg  once daily, while continuing current dose of amlodipine and then to follow up for labs and physical in 3-4 weeks.  Today receive a message from patient requesting a call back regarding questions about his new blood pressure medicine  Outreached patient to discuss hypertension control and medication management.   Patient reports has picked up but not yet started taking valsartan Rx as wanted to check in about potential side effects first. Provide patient with counseling on valsartan, including dose, administration, lab monitoring and regarding rare, but significant, potential side effect of angioedema.   Patient reports he will start taking valsartan tomorrow and call office to schedule lab and office visit appointments  Adjust telephone appointment with CM Pharmacist for follow up of blood pressure results   Follow Up Plan: CM Pharmacist will outreach to patient by telephone again on 8/9 at 3:15 pm  10/9, PharmD, [facility] Clinical Pharmacist [facility] [phone]

## 2022-01-31 ENCOUNTER — Telehealth: Payer: BC Managed Care – PPO

## 2022-02-07 ENCOUNTER — Telehealth: Payer: BC Managed Care – PPO

## 2022-02-07 ENCOUNTER — Telehealth: Payer: Self-pay | Admitting: Pharmacist

## 2022-02-07 NOTE — Telephone Encounter (Signed)
  Chronic Care Management   Outreach Note  02/07/2022 Name: CALEM COCOZZA MRN: [id number] DOB: Nov 17, 1965  Referred by: Smitty Cords, DO Reason for referral : No chief complaint on file.   Patient appearing on report for [place] Metric Hypertension Control due to last documented ambulatory blood pressure of 154/88 on 01/10/2022. Next appointment with PCP is not yet scheduled.  Outreached patient to discuss hypertension control and medication management. Was unable to reach patient via telephone today and have left HIPAA compliant voicemail asking patient to return my call.   Follow Up Plan: Will collaborate with Care Guide to outreach to schedule follow up with me  Estelle Grumbles, PharmD, [facility] Clinical Pharmacist [facility] Care Management [phone]

## 2022-02-20 ENCOUNTER — Telehealth: Payer: Self-pay

## 2022-02-20 NOTE — Chronic Care Management (AMB) (Signed)
  Care Coordination Note  02/20/2022 Name: Ronald Sweeney MRN: [id number] DOB: Nov 09, 1965  Ronald Sweeney is a 56 y.o. year old male who is a primary care patient of Smitty Cords, DO and is actively engaged with the care management team. I reached out to [name] by phone today to assist with re-scheduling a follow up visit with the Pharmacist  Follow up plan: Unsuccessful telephone outreach attempt made. A HIPAA compliant phone message was left for the patient providing contact information and requesting a return call.  The care management team will reach out to the patient again over the next 7 days.  If patient returns call to provider office, please advise to call Embedded Care Management Care Guide Penne Lash  at [phone]  Penne Lash, RMA Care Guide [facility]  [place], [place] [address] Direct Dial: [phone] [name]

## 2022-03-01 NOTE — Chronic Care Management (AMB) (Signed)
  Care Coordination Note  03/01/2022 Name: DEVAL MROCZKA MRN: [id number] DOB: [birth year]  Gustave MEKIAH CAMBRIDGE is a 56 y.o. year old male who is a primary care patient of Smitty Cords, DO and is actively engaged with the care management team. I reached out to [name] by phone today to assist with re-scheduling a follow up visit with the Pharmacist  Follow up plan: Unsuccessful telephone outreach attempt made. A HIPAA compliant phone message was left for the patient providing contact information and requesting a return call.  The care management team will reach out to the patient again over the next 7 days.  If patient returns call to provider office, please advise to call Care Management Care Guide Penne Lash  at [phone]  Penne Lash, RMA Care Guide [facility]  [place], [place] [address] Direct Dial: [phone] [email or web address]

## 2022-03-02 ENCOUNTER — Other Ambulatory Visit: Payer: Self-pay | Admitting: Family Medicine

## 2022-03-02 ENCOUNTER — Ambulatory Visit: Payer: BC Managed Care – PPO | Admitting: Pharmacist

## 2022-03-02 DIAGNOSIS — I10 Essential (primary) hypertension: Secondary | ICD-10-CM

## 2022-03-02 NOTE — Chronic Care Management (AMB) (Signed)
  Chronic Care Management   Outreach Note  03/02/2022 Name: LUCERO IDE MRN: [id number] DOB: [birth year]  I connected with Nosson Marnette Burgess on 03/02/22 by telephone outreach and verified that I am speaking with the correct person using two identifiers.  Patient appearing on report for [place] Metric Hypertension Control due to last documented ambulatory blood pressure of 154/88 on 01/10/2022. Next appointment with PCP is not yet scheduled.   Note on 01/10/2022, provider advised patient via MyChart message to start valsartan 80mg  once daily, while continuing current dose of amlodipine and then to follow up for labs and physical in 3-4 weeks.  Receive a voicemail message from patient requesting a call back. Outreached patient to discuss hypertension control and medication management.   Outpatient Encounter Medications as of 03/02/2022  Medication Sig   valsartan (DIOVAN) 80 MG tablet Take 1 tablet (80 mg total) by mouth daily.   amLODipine (NORVASC) 10 MG tablet TAKE 1 TABLET(10 MG) BY MOUTH DAILY (Patient not taking: Reported on 03/02/2022)   Multiple Vitamins-Minerals (MULTIVITAMIN WITH MINERALS) tablet Take 1 tablet by mouth daily.   No facility-administered encounter medications on file as of 03/02/2022.    Lab Results  Component Value Date   CREATININE 1.40 (H) 02/03/2021   BUN 18 02/03/2021   NA 141 02/03/2021   K 3.7 02/03/2021   CL 103 02/03/2021   CO2 29 02/03/2021    BP Readings from Last 3 Encounters:  01/10/22 (!) 154/88  10/04/21 (!) 184/99  06/20/21 (!) 154/90    Pulse Readings from Last 3 Encounters:  01/10/22 91  10/04/21 86  06/20/21 75    Current medications:  Valsartan 80 mg daily Reports stopped taking amlodipine 10 mg ~ 1.5 weeks ago due to swelling/bleeding in his gums (reports this symptom started prior to start of valsartan and dentist advised likely related to amlodipine) and swelling in his ankles  Reports symptoms have improved since stopped  amlodipine   Home Monitoring: Patient has an automated upper arm home BP machine Unable to review specific readings today as patient is not home, but recalls recent home BP readings ranging: 125-143/78-80s  Conversation today is brief as patient reports that he is currently at work. Patient states he prefers to remain on current regimen of valsartan 80 mg daily for now, in addition to exercising regularly and having improved his diet, rather than adding additional medication for another few weeks.  Assessment/Plan: - Currently uncontrolled - Patient to continue to check home BP twice daily, document, and provide at next provider visit - Discussed dietary modifications, such as reduced salt intake, focus on whole grains, vegetables, lean proteins - Discussed goal of 150 minutes of moderate intensity physical activity weekly - Recommend patient contact office today to schedule due lab and PCP physical appointments   Follow Up Plan: CM Pharmacist will outreach to patient by telephone again within the next 30 days  06/22/21, PharmD, [facility] Clinical Pharmacist [facility] [phone]

## 2022-03-02 NOTE — Patient Instructions (Signed)
Check your blood pressure twice daily, and any time you have concerning symptoms like headache, chest pain, dizziness, shortness of breath, or vision changes.   Our goal is less than 130/80.  To appropriately check your blood pressure, make sure you do the following:  1) Avoid caffeine, exercise, or tobacco products for 30 minutes before checking. Empty your bladder. 2) Sit with your back supported in a flat-backed chair. Rest your arm on something flat (arm of the chair, table, etc). 3) Sit still with your feet flat on the floor, resting, for at least 5 minutes.  4) Check your blood pressure. Take 1-2 readings.  5) Write down these readings and bring with you to any provider appointments.  Bring your home blood pressure machine with you to a provider's office for accuracy comparison at least once a year.   Make sure you take your blood pressure medications before you come to any office visit, even if you were asked to fast for labs.  Estelle Grumbles, PharmD, [facility] [place], CPP Clinical Pharmacist [facility] [phone]

## 2022-03-28 ENCOUNTER — Telehealth: Payer: BC Managed Care – PPO

## 2022-03-28 ENCOUNTER — Telehealth: Payer: Self-pay | Admitting: Pharmacist

## 2022-03-28 NOTE — Telephone Encounter (Signed)
  Chronic Care Management   Outreach Note  03/28/2022 Name: Ronald Sweeney MRN: [id number] DOB: March 19, 1966   Patient appearing on report for [place] Metric Hypertension Control due to last documented ambulatory blood pressure of 154/88 on 01/10/2022. No next appointment with PCP is currently scheduled.   Outreached patient to discuss hypertension control and medication management. Was unable to reach patient via telephone today and have left HIPAA compliant voicemail asking patient to return my call.     Wallace Cullens, PharmD, [place] [phone]

## 2022-04-12 IMAGING — US US RENAL
1 series · 14 of 25 positions shown · non-contrast
Comparison: Prior radiograph from 01/13/2021.

CLINICAL DATA: Follow-up examination for left ureteral calculus.
History of recent ureteroscopy and stenting on 02/06/2021.

EXAM:
RENAL / URINARY TRACT ULTRASOUND COMPLETE

[Series 1: us renal · 0.23mm/px · 14 of 52 slices shown]
[im 1/52]
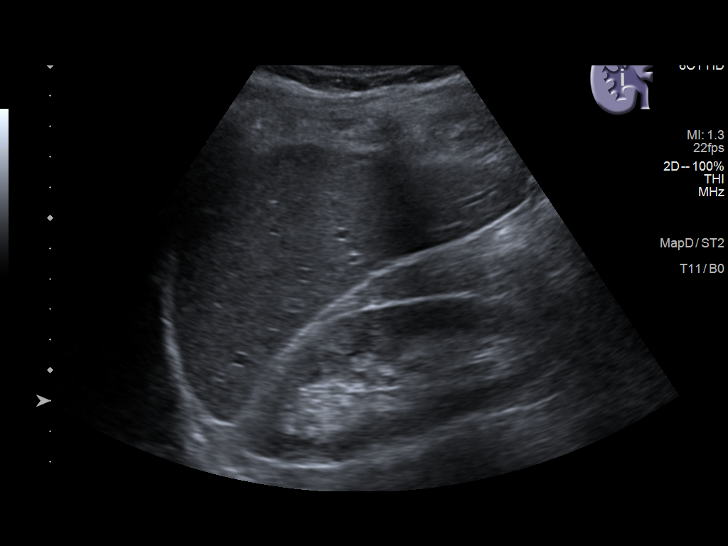
[im 5/52]
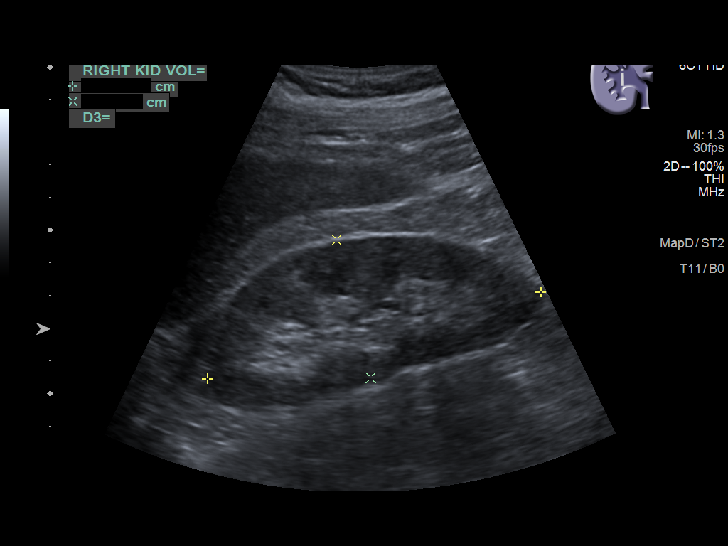
[im 9/52]
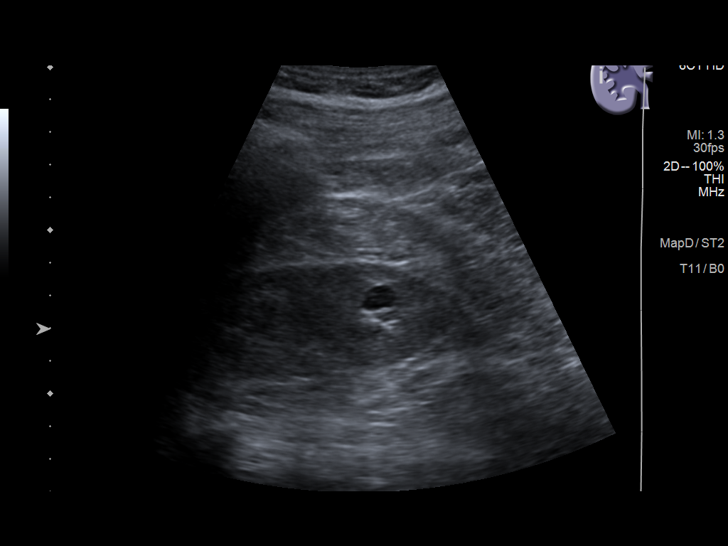
[im 13/52]
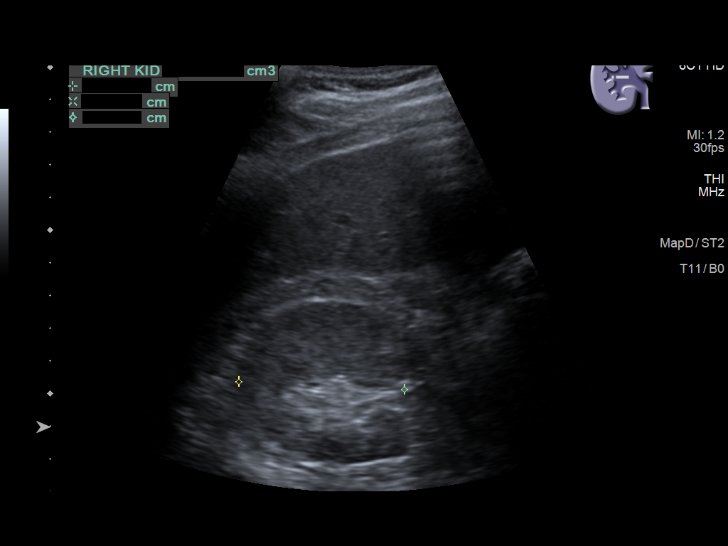
[im 18/52]
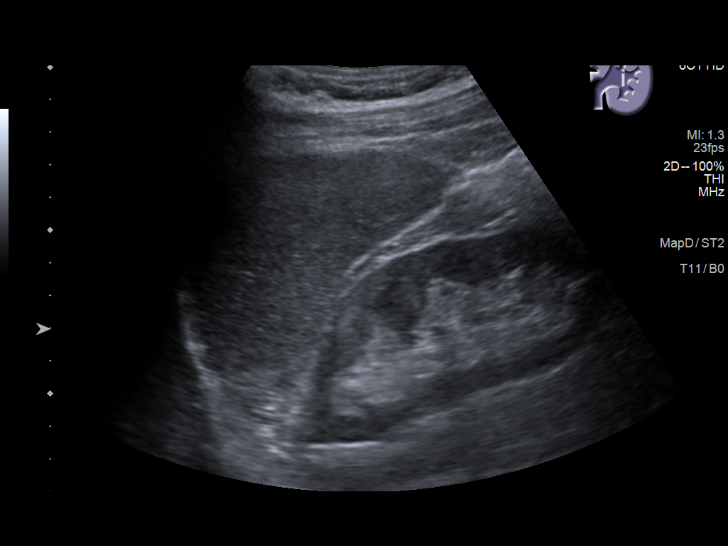
[im 20/52]
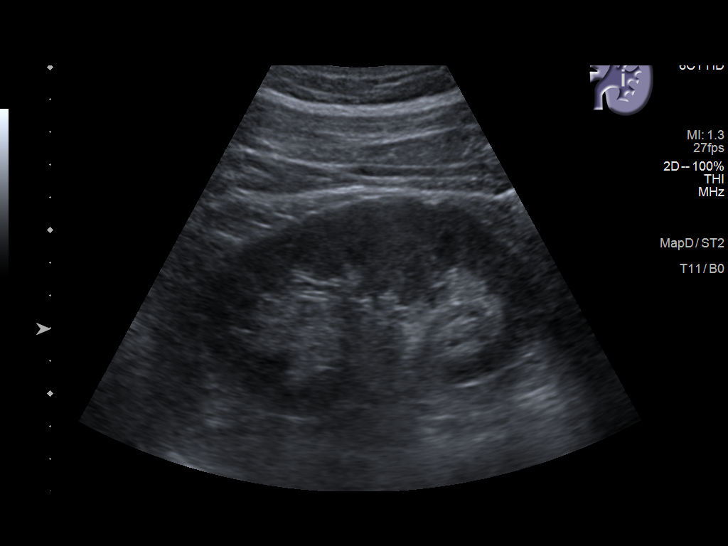
[im 24/52]
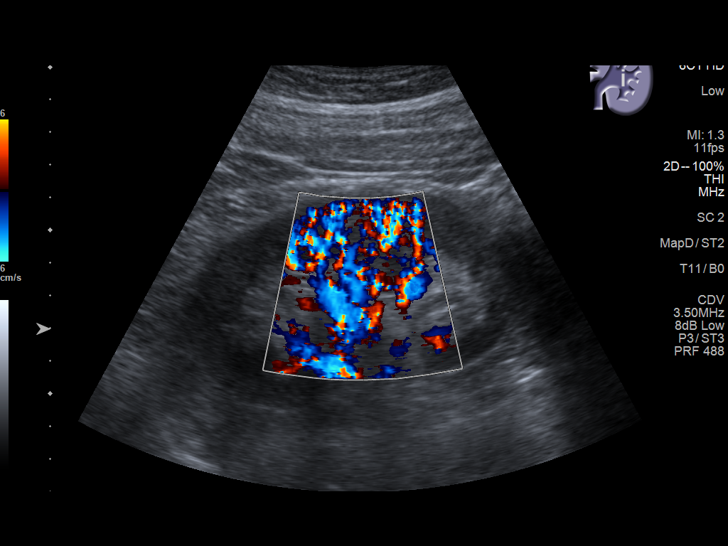
[im 28/52]
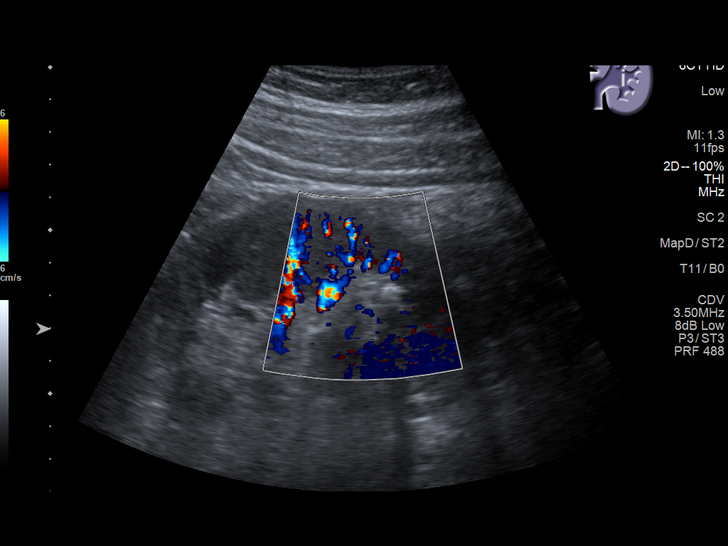
[im 32/52]
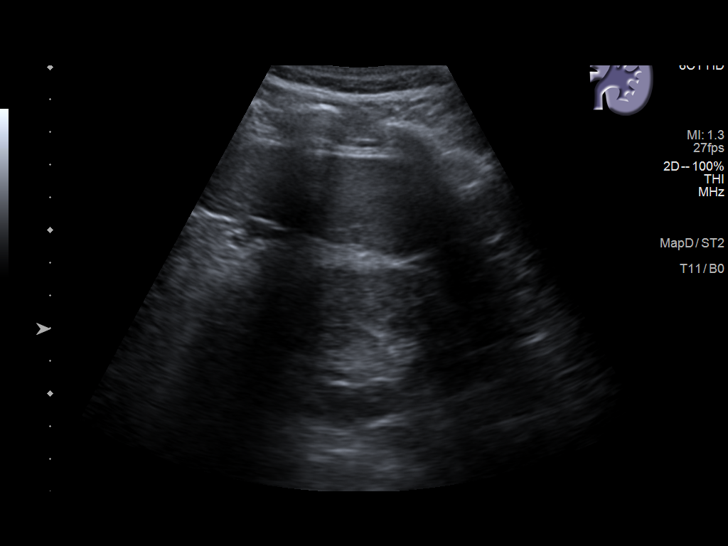
[im 35/52]
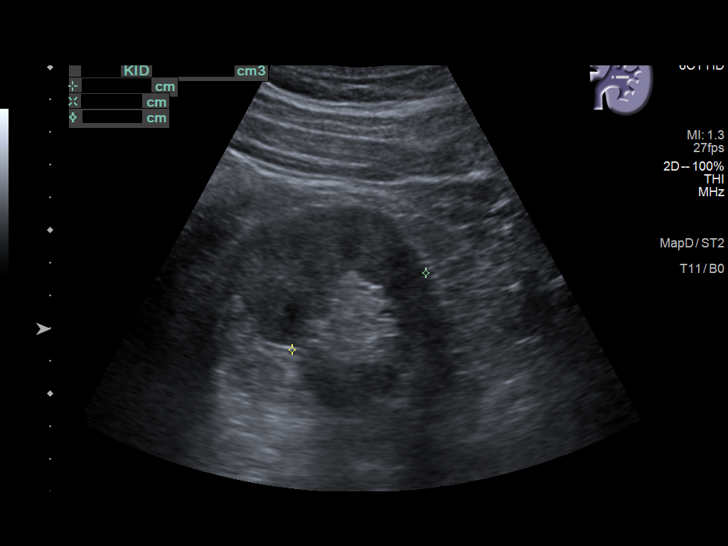
[im 39/52]
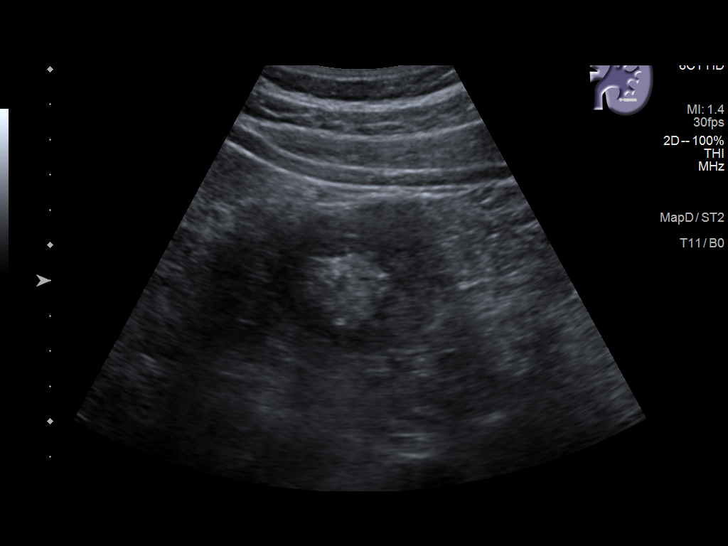
[im 43/52]
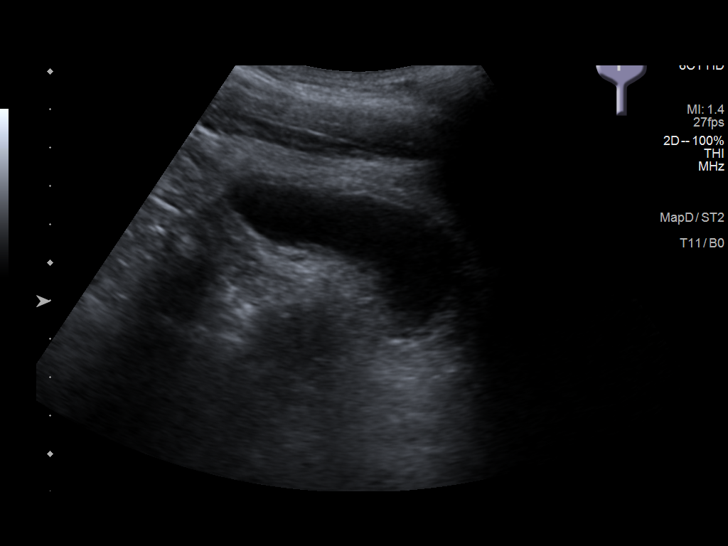
[im 47/52]
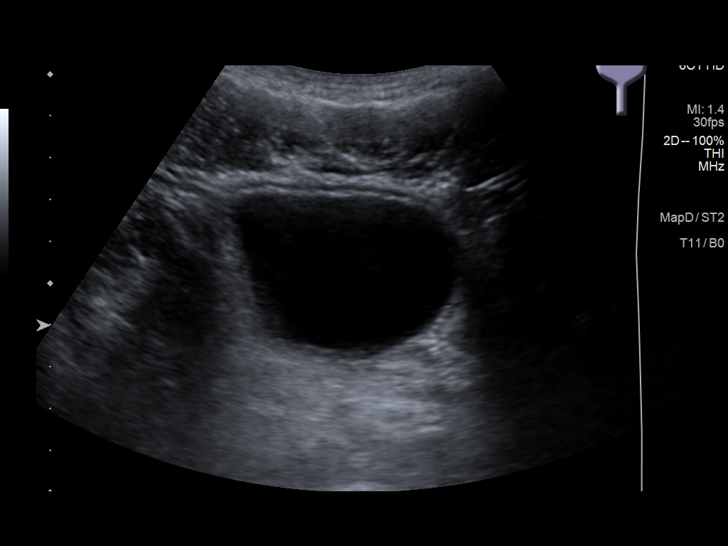
[im 52/52]
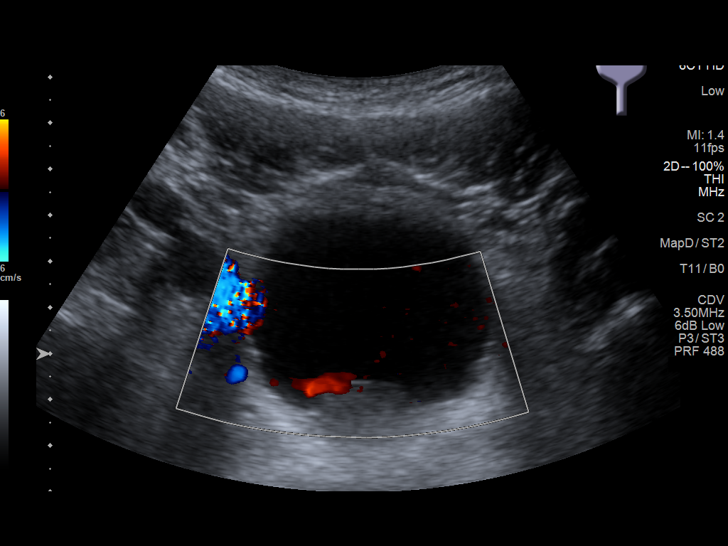

[14 of 25 positions shown; findings below may reference images not displayed]

FINDINGS: Right Kidney:

Renal measurements: 10.5 x 4.4 x 5.1 cm = volume: 121.7 mL. Renal
echogenicity within normal limits. No nephrolithiasis or
hydronephrosis. 1.0 x 0.7 x 1.0 cm benign appearing cyst present at
the lower pole.

Left Kidney:

Renal measurements: 10.3 x 5.8 x 4.7 cm = volume: 148.4 mL. Renal
echogenicity within normal limits. 8 mm nonobstructive calculus
present at the lower pole. No hydronephrosis. 1.0 x 0.8 x 1.0 cm
benign appearing cyst present at the interpolar region.

Bladder:

Appears normal for degree of bladder distention. Bilateral ureteral
jets are visualized.

Other:

Enlarged prostate measures 4.9 cm in diameter with somewhat
lobulated contour.
IMPRESSION: 1. No hydronephrosis or obstructive uropathy.
2. 8 mm nonobstructive left renal nephrolithiasis.
3. Simple bilateral renal cysts measuring up to 1.0 cm as above.
4. Enlarged prostate.

## 2022-04-17 ENCOUNTER — Other Ambulatory Visit: Payer: Self-pay | Admitting: Family Medicine

## 2022-04-17 DIAGNOSIS — I10 Essential (primary) hypertension: Secondary | ICD-10-CM

## 2022-04-17 NOTE — Telephone Encounter (Signed)
Requested medications are due for refill today.  yes  Requested medications are on the active medications list.  yes  Last refill. 01/12/2022 #30 2 rf  Future visit scheduled.   no  Notes to clinic.  Labs are expired.    Requested Prescriptions  Pending Prescriptions Disp Refills   valsartan (DIOVAN) 80 MG tablet [Pharmacy Med Name: VALSARTAN 80MG  TABLETS] 30 tablet 2    Sig: TAKE 1 TABLET(80 MG) BY MOUTH DAILY     Cardiovascular:  Angiotensin Receptor Blockers Failed - 04/17/2022  3:31 AM      Failed - Cr in normal range and within 180 days    Creat  Date Value Ref Range Status  10/21/2017 1.23 0.70 - 1.33 mg/dL Final    Comment:    For patients >36 years of age, the reference limit for Creatinine is approximately 13% higher for people identified as African-American. .    Creatinine, Ser  Date Value Ref Range Status  02/03/2021 1.40 (H) 0.61 - 1.24 mg/dL Final         Failed - K in normal range and within 180 days    Potassium  Date Value Ref Range Status  02/03/2021 3.7 3.5 - 5.1 mmol/L Final         Failed - Last BP in normal range    BP Readings from Last 1 Encounters:  01/10/22 (!) 154/88         Passed - Patient is not pregnant      Passed - Valid encounter within last 6 months    Recent Outpatient Visits           1 month ago Essential hypertension   [place], Virl Diamond, RPH-CPP   2 months ago Essential hypertension   [place], Virl Diamond, RPH-CPP   3 months ago Essential hypertension   [place], Devonne Doughty, DO   3 months ago Essential hypertension   [place], Virl Diamond, RPH-CPP   4 months ago Essential hypertension   [place], [place]       Future Appointments             In 5 months Hollice Espy, MD [place]

## 2022-08-29 ENCOUNTER — Ambulatory Visit: Payer: BC Managed Care – PPO | Admitting: Pharmacist

## 2022-08-29 DIAGNOSIS — I10 Essential (primary) hypertension: Secondary | ICD-10-CM

## 2022-08-29 NOTE — Patient Instructions (Signed)
Check your blood pressure once daily, and any time you have concerning symptoms like headache, chest pain, dizziness, shortness of breath, or vision changes.   Our goal is less than 130/80.  To appropriately check your blood pressure, make sure you do the following:  1) Avoid caffeine, exercise, or tobacco products for 30 minutes before checking. Empty your bladder. 2) Sit with your back supported in a flat-backed chair. Rest your arm on something flat (arm of the chair, table, etc). 3) Sit still with your feet flat on the floor, resting, for at least 5 minutes.  4) Check your blood pressure. Take 1-2 readings.  5) Write down these readings and bring with you to any provider appointments.  Bring your home blood pressure machine with you to a provider's office for accuracy comparison at least once a year.   Make sure you take your blood pressure medications before you come to any office visit, even if you were asked to fast for labs.  Wallace Cullens, PharmD, [place], CPP Clinical Pharmacist [facility] [phone]

## 2022-08-29 NOTE — Progress Notes (Signed)
    Outreach Note  08/29/2022 Name: Ronald Sweeney MRN: [id number] DOB: 1965/10/04  I connected with Ronald Sweeney on 08/29/22 by telephone outreach and verified that I am speaking with the correct person using two identifiers.  Patient appearing on report for [facility] Hypertension Control due to last documented ambulatory blood pressure of 154/88 on 01/10/2022. No next appointment with PCP scheduled.  Receive voicemail from patient's wife requesting a call back to patient. Outreach to patient by telephone today.  Outreached patient to discuss hypertension control and medication management.   Outpatient Encounter Medications as of 08/29/2022  Medication Sig   Multiple Vitamins-Minerals (MULTIVITAMIN WITH MINERALS) tablet Take 1 tablet by mouth daily.   valsartan (DIOVAN) 80 MG tablet TAKE 1 TABLET(80 MG) BY MOUTH DAILY   No facility-administered encounter medications on file as of 08/29/2022.    Lab Results  Component Value Date   CREATININE 1.40 (H) 02/03/2021   BUN 18 02/03/2021   NA 141 02/03/2021   K 3.7 02/03/2021   CL 103 02/03/2021   CO2 29 02/03/2021    BP Readings from Last 3 Encounters:  01/10/22 (!) 154/88  10/04/21 (!) 184/99  06/20/21 (!) 154/90    Pulse Readings from Last 3 Encounters:  01/10/22 91  10/04/21 86  06/20/21 75    Current medications: valsartan 80 mg daily (reports restarted taking yesterday, 2/27) - Reports stopped taking his valsartan a couple of months ago as wanted to instead try to control his blood pressure through exercise and weight loss. However, denies monitoring his blood pressure during this time until seen for a dental visit recently and was advised his BP was elevated.  Previous therapies tried: lisinopril (cough); amlodipine (stopped as dentist advised linked to swelling of his gums)   Home Monitoring: Patient has an automated upper arm home BP machine Denies checking home blood pressure recently Counsel on BP  monitoring technique  Current physical activity: reports working out ~60 minutes daily  Reports is eating better, has reduced salt intake  Caffeine: drinks 1 large cup of decaffeinated coffee in mornings  Assessment/Plan: - Currently uncontrolled - Reviewed goal blood pressure <130/80 - Counseled on long term microvascular and macrovascular complications of uncontrolled hypertension - Reviewed appropriate home BP monitoring technique (avoid caffeine, smoking, and exercise for 30 minutes before checking, rest for at least 5 minutes before taking BP, sit with feet flat on the floor and back against a hard surface, uncross legs, and rest arm on flat surface) - Reviewed to check blood pressure daily, document, and provide at next provider visit - Discussed dietary modifications, such as reduced salt intake, focus on whole grains, vegetables, lean proteins - Again recommend patient contact office today to schedule due lab and PCP physical appointments    Follow Up Plan:  Patient preference to call back to schedule follow up appointment with clinical pharmacist after his appointment with PCP Provide patient with contact information for clinic pharmacist and care guide scheduler   Wallace Cullens, PharmD, [facility] [place] [phone]

## 2022-09-05 ENCOUNTER — Other Ambulatory Visit: Payer: Self-pay | Admitting: Family Medicine

## 2022-09-05 ENCOUNTER — Ambulatory Visit: Payer: BC Managed Care – PPO | Admitting: Family Medicine

## 2022-09-05 ENCOUNTER — Encounter: Payer: Self-pay | Admitting: Family Medicine

## 2022-09-05 VITALS — BP 170/110 | HR 71 | Ht 69.0 in | Wt 187.4 lb

## 2022-09-05 DIAGNOSIS — I10 Essential (primary) hypertension: Secondary | ICD-10-CM | POA: Diagnosis not present

## 2022-09-05 DIAGNOSIS — Z125 Encounter for screening for malignant neoplasm of prostate: Secondary | ICD-10-CM

## 2022-09-05 DIAGNOSIS — R7309 Other abnormal glucose: Secondary | ICD-10-CM

## 2022-09-05 MED ORDER — VALSARTAN 160 MG PO TABS: 160.0000 mg | ORAL_TABLET | Freq: Every day | ORAL | 1 refills | Status: DC

## 2022-09-05 NOTE — Progress Notes (Signed)
Subjective:    Patient ID: Ronald Sweeney, male    DOB: 08-31-1965, 58 y.o.   MRN: [id number]  Ronald Sweeney is a 57 y.o. male presenting on 09/05/2022 for Hypertension   HPI  CHRONIC HTN: Last visit with me 12/2021, has followed with [facility]  Previously on Valsartan medication He had BP readings 130-150s / 80-90s  He was worried about having leg swelling, and eventually came off of medication. He and pursued Garlic supplement.  1 week ago he had gum surgery in [place]. Elevated BP 170s, they helped him calm down and some BP improved.  He is scared now about his BP.  He has restarted BP medication 1 week ago Valsartan '80mg'$  daily  Current Meds - Valsartan '80mg'$  daily (previously No longer taking medication  He has quit drinking coffee previously He limits sodium content to [phone] per day max.   He takes protein powders regularly that are low sodium He is eating bananas for potassium, increased fruits. Apples, grapes, strawberries. Beet supplement and juice Drinks mostly water  He can feel his heart racing prior to BP reading with anticipation.   He is currently taking Amlodipine '10mg'$  daily, tolerating well without any edema or other side effect.   Past history ACEi Cough on Lisinopril No longer on Losartan + HCTZ in past he had reported side effect, unsure precisely, he had self discontinued.  Admits poor sleep, has to get to work at [employer], wakes up at 5am, often can go to bed 1230am   Home BP readings Home BP cuff reading today 190/113  2/26 171/102 2/29 174/104 3/1 159/101 3/3 167/101 3/4 178/116 3/5 183/114 3/6 151/101        09/05/2022    4:08 PM 01/10/2022    4:09 PM 10/12/2016    9:04 AM  Depression screen PHQ 2/9  Decreased Interest 0 0 0  Down, Depressed, Hopeless 0 0 0  PHQ - 2 Score 0 0 0  Altered sleeping 1    Tired, decreased energy 0    Change in appetite 0    Feeling bad or failure about yourself  0    Trouble  concentrating 0    Moving slowly or fidgety/restless 0    Suicidal thoughts 0    PHQ-9 Score 1    Difficult doing work/chores Not difficult at all      Social History   Tobacco Use   Smoking status: Never   Smokeless tobacco: Never  Substance Use Topics   Alcohol use: Yes    Alcohol/week: 0.0 standard drinks of alcohol    Comment: occasional   Drug use: No    Review of Systems Per HPI unless specifically indicated above     Objective:    BP (!) 170/110   Pulse 71   Ht '5\' 9"'$  (1.753 m)   Wt 187 lb 6.4 oz (85 kg)   SpO2 99%   BMI 27.67 kg/m   Wt Readings from Last 3 Encounters:  09/05/22 187 lb 6.4 oz (85 kg)  01/10/22 183 lb (83 kg)  10/04/21 189 lb (85.7 kg)    Physical Exam Vitals and nursing note reviewed.  Constitutional:      General: He is not in acute distress.    Appearance: He is well-developed. He is not diaphoretic.     Comments: Well-appearing, comfortable, cooperative  HENT:     Head: Normocephalic and atraumatic.  Eyes:     General:  Right eye: No discharge.        Left eye: No discharge.     Conjunctiva/sclera: Conjunctivae normal.  Neck:     Thyroid: No thyromegaly.  Cardiovascular:     Rate and Rhythm: Normal rate and regular rhythm.     Pulses: Normal pulses.     Heart sounds: Normal heart sounds. No murmur heard. Pulmonary:     Effort: Pulmonary effort is normal. No respiratory distress.     Breath sounds: Normal breath sounds. No wheezing or rales.  Musculoskeletal:        General: Normal range of motion.     Cervical back: Normal range of motion and neck supple.  Lymphadenopathy:     Cervical: No cervical adenopathy.  Skin:    General: Skin is warm and dry.     Findings: No erythema or rash.  Neurological:     Mental Status: He is alert and oriented to person, place, and time. Mental status is at baseline.  Psychiatric:        Behavior: Behavior normal.     Comments: Well groomed, good eye contact, normal speech and  thoughts    Results for orders placed or performed in visit on 02/23/21  Microscopic Examination   Urine  Result Value Ref Range   WBC, UA 6-10 (A) 0 - 5 /hpf   RBC, Urine >30 (A) 0 - 2 /hpf   Epithelial Cells (non renal) 0-10 0 - 10 /hpf   Bacteria, UA None seen None seen/Few  Urinalysis, Complete  Result Value Ref Range   Specific Gravity, UA 1.015 1.005 - 1.030   pH, UA 7.0 5.0 - 7.5   Color, UA Yellow Yellow   Appearance Ur Cloudy (A) Clear   Leukocytes,UA 1+ (A) Negative   Protein,UA 2+ (A) Negative/Trace   Glucose, UA Negative Negative   Ketones, UA Trace (A) Negative   RBC, UA 2+ (A) Negative   Bilirubin, UA Negative Negative   Urobilinogen, Ur 0.2 0.2 - 1.0 mg/dL   Nitrite, UA Negative Negative   Microscopic Examination See below:       Assessment & Plan:   Problem List Items Addressed This Visit     Essential hypertension - Primary    Still Uncontrolled HTN, some improvement in home readings back on Valsartan '80mg'$  Off med for months, then recently restarted therapy 1 week. Still not controlled  Previously Calibrate home electronic cuff is similar to our electronic cuff Today his home BP cuff is higher Failed Lisinopril ACEi-Cough, HCTZ/Losartan - uncertain why, Amlodipine - gum swelling    Plan:  Increase dose Valsartan from 80 to '160mg'$  daily, new rx sent. Keep up the good work with plans to limit salt caffeine and improve lifestyle overall Discussion on importance of adherence Expressed significant concern that given long history of difficulty with HYPERTENSION now we should consider pursuing referral to [facility] in [place] w Dr Skeet Latch in the future if still elevated      Relevant Medications   valsartan (DIOVAN) 160 MG tablet    Meds ordered this encounter  Medications   valsartan (DIOVAN) 160 MG tablet    Sig: Take 1 tablet (160 mg total) by mouth daily.    Dispense:  90 tablet    Refill:  1    Dose increase      Follow up plan: Return in about 6 months (around 03/08/2023) for 4-5 weeks lab only then 1 week later Follow-up HTN / BP.  Future  labs ordered for 10/03/22   Nobie Putnam, [facility] 09/05/2022, 4:29 PM

## 2022-09-05 NOTE — Assessment & Plan Note (Signed)
Still Uncontrolled HTN, some improvement in home readings back on Valsartan '80mg'$  Off med for months, then recently restarted therapy 1 week. Still not controlled  Previously Calibrate home electronic cuff is similar to our electronic cuff Today his home BP cuff is higher Failed Lisinopril ACEi-Cough, HCTZ/Losartan - uncertain why, Amlodipine - gum swelling    Plan:  Increase dose Valsartan from 80 to '160mg'$  daily, new rx sent. Keep up the good work with plans to limit salt caffeine and improve lifestyle overall Discussion on importance of adherence Expressed significant concern that given long history of difficulty with HYPERTENSION now we should consider pursuing referral to [facility] in [facility] w Dr Skeet Latch in the future if still elevated

## 2022-09-05 NOTE — Patient Instructions (Addendum)
Thank you for coming to the office today.  Increase dose Valsartan from 80 to '160mg'$  daily, new rx sent.  Keep up the good work with plans to limit salt caffeine and improve lifestyle overall  We can consider referral to [facility] in [facility] w Dr Skeet Latch in the future if still elevated  [facility] [address] [place], [place] [address] [phone]   Please schedule a Follow-up Appointment to: Return in about 6 months (around 03/08/2023) for 4-5 weeks lab only then 1 week later Follow-up HTN / BP.  If you have any other questions or concerns, please feel free to call the office or send a message through [facility]. You may also schedule an earlier appointment if necessary.  Additionally, you may be receiving a survey about your experience at our office within a few days to 1 week by e-mail or mail. We value your feedback.  Nobie Putnam, DO [place]

## 2022-09-17 ENCOUNTER — Ambulatory Visit: Payer: BC Managed Care – PPO | Admitting: Pharmacist

## 2022-09-17 DIAGNOSIS — I10 Essential (primary) hypertension: Secondary | ICD-10-CM

## 2022-09-17 NOTE — Progress Notes (Addendum)
Outreach Note  09/17/2022 Name: Ronald Sweeney MRN: [id number] DOB: February 16, 1966  I connected with [name] on 09/17/22 by telephone outreach and verified that I am speaking with the correct person using two identifiers.  Patient appearing on report for [facility] Hypertension Control due to last documented ambulatory blood pressure of 170/110 on 09/05/2022. Next appointment with PCP is 10/09/2022.   Outreached patient to discuss hypertension control and medication management.   Outpatient Encounter Medications as of 09/17/2022  Medication Sig   Multiple Vitamins-Minerals (MULTIVITAMIN WITH MINERALS) tablet Take 1 tablet by mouth daily.   valsartan (DIOVAN) 160 MG tablet Take 1 tablet (160 mg total) by mouth daily.   No facility-administered encounter medications on file as of 09/17/2022.    Lab Results  Component Value Date   CREATININE 1.40 (H) 02/03/2021   BUN 18 02/03/2021   NA 141 02/03/2021   K 3.7 02/03/2021   CL 103 02/03/2021   CO2 29 02/03/2021    BP Readings from Last 3 Encounters:  09/05/22 (!) 170/110  01/10/22 (!) 154/88  10/04/21 (!) 184/99    Pulse Readings from Last 3 Encounters:  09/05/22 71  01/10/22 91  10/04/21 86    Current medications: valsartan 160 mg daily (increased to current dose on 3/8)  Denies missed doses  Previous therapies tried: amlodipine 10 mg (gum/leg swelling); lisinopril (ACEi-cough)  Home Monitoring: Patient has an automated upper arm home BP machine Counsel on BP monitoring technique Current blood pressure readings:  - 3/18: 173/103, HR 73 - 3/17: 186/96, HR 83 - 3/16: 175/116, HR 82 - 3/15: 165/101, HR 89 - 3/14: 164/105, HR 81 - 3/13: 175/107, HR 85 - 3/12: 162/101, HR 79 - 3/11: 163/91, HR 83  Note reports above readings all taken in morning right before/right after his valsartan dose  Reports concerned that blood pressure monitor is not accurate  Patient denies hypotensive s/sx including  dizziness, lightheadedness. Patient denies hypertensive symptoms including headache, chest pain, shortness of breath  Eating lightly salted peanuts this week, but otherwise has been trying to limit salt intake. Reports reviewing nutrition labels for sodium content of foods  Reports sleep has been limited recently, but admits to poor sleep hygiene, including sleeping with TV on  Caffeine: decaffeinated 1 cup/day  Current physical activity: workout for ~40 minutes and then treadmill and weight lifts x 4-5 days/week  Reports feeling stressed around checking readings and feels like contributing to elevated results   Assessment/Plan: - Currently uncontrolled - Reviewed goal blood pressure <130/80 - Reviewed appropriate administration of medication regimen - Counseled on long term microvascular and macrovascular complications of uncontrolled hypertension - Reviewed appropriate home BP monitoring technique (avoid caffeine, smoking, and exercise for 30 minutes before checking, rest for at least 5 minutes before taking BP, sit with feet flat on the floor and back against a hard surface, uncross legs, and rest arm on flat surface)  Counsel to check at least 1-2 hours after morning blood pressure medicine  - Encourage patient to obtain a new validated upper arm blood pressure monitor as does not find current monitor to be accurate - Discussed dietary modifications, such as reduced salt intake, focus on whole grains, vegetables, lean proteins - Counsel on sleep hygiene  - Patient prefers to hold off on making any medication changes today, rather prefers to obtain new monitor, work on making changes discussed and then follow up again in 2 weeks.  Will collaborate with PCP - Reviewed to  check blood pressure daily, document, and provide at next provider visit, but to contact office sooner if readings outside established parameters or for any symptoms   Follow Up Plan: CM Pharmacist will outreach to  patient by telephone again on 10/01/2022 at 3:30 pm  Wallace Cullens, PharmD, [place] [phone]

## 2022-09-17 NOTE — Patient Instructions (Signed)
Check your blood pressure once daily, and any time you have concerning symptoms like headache, chest pain, dizziness, shortness of breath, or vision changes.   Our goal is less than 130/80.  To appropriately check your blood pressure, make sure you do the following:  1) Avoid caffeine, exercise, or tobacco products for 30 minutes before checking. Empty your bladder. 2) Sit with your back supported in a flat-backed chair. Rest your arm on something flat (arm of the chair, table, etc). 3) Sit still with your feet flat on the floor, resting, for at least 5 minutes.  4) Check your blood pressure. Take 1-2 readings.  5) Write down these readings and bring with you to any provider appointments.  Bring your home blood pressure machine with you to a provider's office for accuracy comparison at least once a year.   Make sure you take your blood pressure medications before you come to any office visit, even if you were asked to fast for labs.  Cherita Hebel Eleora Sutherland, PharmD, BCACP, CPP Clinical Pharmacist [facility] [place] [phone]  

## 2022-10-01 ENCOUNTER — Telehealth: Payer: BC Managed Care – PPO

## 2022-10-01 ENCOUNTER — Telehealth: Payer: Self-pay | Admitting: Pharmacist

## 2022-10-01 NOTE — Telephone Encounter (Signed)
   Outreach Note  10/01/2022 Name: Ronald Sweeney MRN: [id number] DOB: 1965/11/01  Referred by: Olin Hauser, DO Reason for referral : No chief complaint on file.   Was unable to reach patient via telephone today and have left HIPAA compliant voicemail asking patient to return my call.    Follow Up Plan: Will collaborate with Care Guide to outreach to schedule follow up with me  Wallace Cullens, PharmD, [place] [phone]

## 2022-10-02 ENCOUNTER — Other Ambulatory Visit: Payer: Self-pay

## 2022-10-02 DIAGNOSIS — I10 Essential (primary) hypertension: Secondary | ICD-10-CM

## 2022-10-02 DIAGNOSIS — R7309 Other abnormal glucose: Secondary | ICD-10-CM

## 2022-10-02 DIAGNOSIS — Z125 Encounter for screening for malignant neoplasm of prostate: Secondary | ICD-10-CM

## 2022-10-03 ENCOUNTER — Other Ambulatory Visit: Payer: BC Managed Care – PPO

## 2022-10-09 ENCOUNTER — Ambulatory Visit: Payer: BC Managed Care – PPO | Admitting: Family Medicine

## 2022-10-09 ENCOUNTER — Ambulatory Visit: Payer: BC Managed Care – PPO | Admitting: Urology

## 2022-10-09 ENCOUNTER — Telehealth: Payer: Self-pay

## 2022-10-09 NOTE — Progress Notes (Signed)
  Care Coordination Note  10/09/2022 Name: Ronald Sweeney MRN: [id number] DOB: [birth year]  Ronald Sweeney is a 57 y.o. year old male who is a primary care patient of Smitty Cords, DO and is actively engaged with the Chronic Care Management team. I reached out to [name] by phone today to assist with re-scheduling a follow up visit with the Pharmacist  Follow up plan: Unsuccessful telephone outreach attempt made. A HIPAA compliant phone message was left for the patient providing contact information and requesting a return call.  The care management team will reach out to the patient again over the next 7 days.  If patient returns call to provider office, please advise to call CCM Care Guide Penne Lash  at [phone]  Penne Lash, RMA Care Guide [facility]  [place], [place] [address] Direct Dial: [phone] [email or web address]

## 2022-10-10 ENCOUNTER — Other Ambulatory Visit: Payer: Self-pay

## 2022-10-10 ENCOUNTER — Ambulatory Visit: Payer: BC Managed Care – PPO | Admitting: Pharmacist

## 2022-10-10 DIAGNOSIS — Z125 Encounter for screening for malignant neoplasm of prostate: Secondary | ICD-10-CM

## 2022-10-10 DIAGNOSIS — Z0000 Encounter for general adult medical examination without abnormal findings: Secondary | ICD-10-CM

## 2022-10-10 DIAGNOSIS — I10 Essential (primary) hypertension: Secondary | ICD-10-CM

## 2022-10-10 DIAGNOSIS — R7309 Other abnormal glucose: Secondary | ICD-10-CM

## 2022-10-10 DIAGNOSIS — N201 Calculus of ureter: Secondary | ICD-10-CM

## 2022-10-10 NOTE — Patient Instructions (Signed)
Check your blood pressure once daily, and any time you have concerning symptoms like headache, chest pain, dizziness, shortness of breath, or vision changes.   Our goal is less than 130/80.  To appropriately check your blood pressure, make sure you do the following:  1) Avoid caffeine, exercise, or tobacco products for 30 minutes before checking. Empty your bladder. 2) Sit with your back supported in a flat-backed chair. Rest your arm on something flat (arm of the chair, table, etc). 3) Sit still with your feet flat on the floor, resting, for at least 5 minutes.  4) Check your blood pressure. Take 1-2 readings.  5) Write down these readings and bring with you to any provider appointments.  Bring your home blood pressure machine with you to a provider's office for accuracy comparison at least once a year.   Make sure you take your blood pressure medications before you come to any office visit, even if you were asked to fast for labs.  Takari Duncombe Remmington Teters, PharmD, BCACP, CPP Clinical Pharmacist [facility] [place] [phone]  

## 2022-10-10 NOTE — Progress Notes (Signed)
Outreach Note  10/10/2022 Name: Ronald Sweeney MRN: [id number] DOB: 1966-06-03  I connected with Ronald Sweeney on 10/10/22 by telephone outreach and verified that I am speaking with the correct person using two identifiers.  Receive voicemail from patient requesting a call back.  Patient appearing on report for [facility]ric Hypertension Control due to last documented ambulatory blood pressure of 170/110 on 09/05/2022. Next appointment with PCP is 10/09/2022.   Outreached patient to discuss hypertension control and medication management.   Outpatient Encounter Medications as of 10/10/2022  Medication Sig   valsartan (DIOVAN) 160 MG tablet Take 1 tablet (160 mg total) by mouth daily. (Patient taking differently: Take 80 mg by mouth daily.)   Multiple Vitamins-Minerals (MULTIVITAMIN WITH MINERALS) tablet Take 1 tablet by mouth daily.   No facility-administered encounter medications on file as of 10/10/2022.    Lab Results  Component Value Date   CREATININE 1.40 (H) 02/03/2021   BUN 18 02/03/2021   NA 141 02/03/2021   K 3.7 02/03/2021   CL 103 02/03/2021   CO2 29 02/03/2021    BP Readings from Last 3 Encounters:  09/05/22 (!) 170/110  01/10/22 (!) 154/88  10/04/21 (!) 184/99    Pulse Readings from Last 3 Encounters:  09/05/22 71  01/10/22 91  10/04/21 86    Current medications: valsartan 80 mg day - reports self-decreased his valsartan to this dose on 4/7 - Reports recently started having some chronic skin itching. Admits recently started using new soap, but decided his itching related to valsartan 160 mg   Denies hives or any visible change to skin except redness from itching. Denies other symptoms  Both stopped new soap and self-decreased dose to valsartan 80 mg daily since Sunday  - Reports feels some better since stopped soap and decreased dose   Previous therapies tried: amlodipine 10 mg (gum/leg swelling); lisinopril (ACEi-cough)   Home  Monitoring: Patient has an automated upper arm home BP machine Unable to review specific reading today as does not have his log with him, but recalls recent systolic readings ranging in 140s-150s   Has reported concern that blood pressure monitor is not accurate and has ordered a new monitor, but has not yet arrived   Patient denies hypotensive s/sx including dizziness, lightheadedness. Patient denies hypertensive symptoms including headache, chest pain, shortness of breath   Reports limiting salt intake and reviewing nutrition labels for sodium content of foods; using salt-free seasoning   Caffeine: decaffeinated 1 cup/day   Current physical activity: workout for ~40 minutes and then treadmill and weight lifts x most days/week  Reports has been working on weight loss, current weight ~182 lbs   Has reported feels stressed around checking readings and feels like contributing to elevated results     Assessment/Plan: - Currently uncontrolled - Reviewed goal blood pressure <130/80 - Reviewed appropriate administration of medication regimen - Counseled on long term microvascular and macrovascular complications of uncontrolled hypertension - Have reviewed appropriate home BP monitoring technique (avoid caffeine, smoking, and exercise for 30 minutes before checking, rest for at least 5 minutes before taking BP, sit with feet flat on the floor and back against a hard surface, uncross legs, and rest arm on flat surface)             Counseled to check at least 1-2 hours after morning blood pressure medicine  - Encourage patient to obtain a new validated upper arm blood pressure monitor as does not find current monitor  to be accurate - Discussed dietary modifications, such as reduced salt intake, focus on whole grains, vegetables, lean proteins - Patient prefers to hold off on making any medication changes today, rather prefers to obtain new monitor, work on making changes discussed and then follow  up with PCP next week as scheduled - Reviewed to check blood pressure daily, document, and provide at next provider visit, but to contact office sooner if readings outside established parameters or for any symptoms - Will collaborate with PCP      Follow Up Plan:   1) Patient to return to clinic tomorrow for lab work 2) Patient to keep appointment with PCP on 4/17. Advised patient to contact office if symptoms of itching worsen or has new symptoms 3) CM Pharmacist will outreach to patient by telephone again on 11/05/2022 at 3:30 PM    Estelle Grumbles, PharmD, [facility] Clinical Pharmacist [facility] [phone]

## 2022-10-11 ENCOUNTER — Other Ambulatory Visit: Payer: BC Managed Care – PPO

## 2022-10-11 DIAGNOSIS — Z0000 Encounter for general adult medical examination without abnormal findings: Secondary | ICD-10-CM | POA: Diagnosis not present

## 2022-10-11 DIAGNOSIS — Z125 Encounter for screening for malignant neoplasm of prostate: Secondary | ICD-10-CM | POA: Diagnosis not present

## 2022-10-11 DIAGNOSIS — I10 Essential (primary) hypertension: Secondary | ICD-10-CM | POA: Diagnosis not present

## 2022-10-11 DIAGNOSIS — R7309 Other abnormal glucose: Secondary | ICD-10-CM | POA: Diagnosis not present

## 2022-10-11 LAB — CBC WITH DIFFERENTIAL/PLATELET
Absolute Monocytes: 350 cells/uL (ref 200–950)
HCT: 43.5 % (ref 38.5–50.0)
MCH: 27.4 pg (ref 27.0–33.0)
MCHC: 32.9 g/dL (ref 32.0–36.0)
RDW: 12.2 % (ref 11.0–15.0)
WBC: 6.6 10*3/uL (ref 3.8–10.8)

## 2022-10-12 ENCOUNTER — Ambulatory Visit (INDEPENDENT_AMBULATORY_CARE_PROVIDER_SITE_OTHER): Payer: Self-pay | Admitting: Adult Health

## 2022-10-12 VITALS — BP 156/88 | HR 82 | Temp 97.1°F

## 2022-10-12 DIAGNOSIS — W460XXA Contact with hypodermic needle, initial encounter: Secondary | ICD-10-CM

## 2022-10-12 LAB — CBC WITH DIFFERENTIAL/PLATELET
Basophils Absolute: 33 cells/uL (ref 0–200)
Basophils Relative: 0.5 %
Eosinophils Absolute: 139 cells/uL (ref 15–500)
Eosinophils Relative: 2.1 %
Hemoglobin: 14.3 g/dL (ref 13.2–17.1)
Lymphs Abs: 2198 cells/uL (ref 850–3900)
MCV: 83.3 fL (ref 80.0–100.0)
MPV: 11.3 fL (ref 7.5–12.5)
Monocytes Relative: 5.3 %
Neutro Abs: 3881 cells/uL (ref 1500–7800)
Neutrophils Relative %: 58.8 %
Platelets: 211 10*3/uL (ref 140–400)
RBC: 5.22 10*6/uL (ref 4.20–5.80)
Total Lymphocyte: 33.3 %

## 2022-10-12 LAB — TSH: TSH: 1.43 mIU/L (ref 0.40–4.50)

## 2022-10-12 LAB — COMPREHENSIVE METABOLIC PANEL
AG Ratio: 1.8 (calc) (ref 1.0–2.5)
ALT: 18 U/L (ref 9–46)
AST: 20 U/L (ref 10–35)
Albumin: 4.6 g/dL (ref 3.6–5.1)
Alkaline phosphatase (APISO): 42 U/L (ref 35–144)
BUN: 14 mg/dL (ref 7–25)
CO2: 28 mmol/L (ref 20–32)
Calcium: 10 mg/dL (ref 8.6–10.3)
Chloride: 102 mmol/L (ref 98–110)
Creat: 1.25 mg/dL (ref 0.70–1.30)
Globulin: 2.5 g/dL (calc) (ref 1.9–3.7)
Glucose, Bld: 82 mg/dL (ref 65–99)
Potassium: 3.9 mmol/L (ref 3.5–5.3)
Sodium: 139 mmol/L (ref 135–146)
Total Bilirubin: 0.8 mg/dL (ref 0.2–1.2)
Total Protein: 7.1 g/dL (ref 6.1–8.1)

## 2022-10-12 LAB — HEMOGLOBIN A1C
Hgb A1c MFr Bld: 5.8 % of total Hgb — ABNORMAL HIGH (ref <5.7)
Mean Plasma Glucose: 120 mg/dL
eAG (mmol/L): 6.6 mmol/L

## 2022-10-12 LAB — LIPID PANEL
Cholesterol: 207 mg/dL — ABNORMAL HIGH (ref <200)
HDL: 80 mg/dL (ref >=40)
LDL Cholesterol (Calc): 113 mg/dL (calc) — ABNORMAL HIGH
Non-HDL Cholesterol (Calc): 127 mg/dL (calc) (ref <130)
Total CHOL/HDL Ratio: 2.6 (calc) (ref <5.0)
Triglycerides: 54 mg/dL (ref <150)

## 2022-10-12 LAB — PSA: PSA: 1.7 ng/mL (ref <=4.00)

## 2022-10-12 NOTE — Addendum Note (Signed)
Addended by: Johnna Acosta on: 10/12/2022 08:18 AM   Modules accepted: Orders

## 2022-10-12 NOTE — Progress Notes (Addendum)
[facility] [address]  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     Provider Note Patient has small injection site to pad of right middle finger, and on the lateral aspect of left pointer finger.  The right hand did not bleed, but the left hand did a small amount.  He was wearing gloves.    Diagnosis  1. Needle stick, hypodermic, accidental, initial encounter Obtain baseline labs at this time. Keep areas clean, continue to wear gloves. Will call patient to review labs, and to discuss next steps for follow up. - HIV antibody (with reflex) - Hepatitis B surface antibody,qualitative - Hepatitis C Antibody - CBC w/Diff - Comprehensive metabolic panel        Medications Prescribed  No orders of the defined types were placed in this encounter.   Referred to: No one at this time.     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

## 2022-10-12 NOTE — Addendum Note (Signed)
Addended by: Johnna Acosta on: 10/12/2022 08:39 AM   Modules accepted: Orders

## 2022-10-13 LAB — SPECIMEN STATUS REPORT

## 2022-10-13 LAB — COMPREHENSIVE METABOLIC PANEL
ALT: 21 IU/L (ref 0–44)
AST: 20 IU/L (ref 0–40)
Albumin/Globulin Ratio: 1.8 (ref 1.2–2.2)
Albumin: 4.6 g/dL (ref 3.8–4.9)
Alkaline Phosphatase: 55 IU/L (ref 44–121)
BUN/Creatinine Ratio: 13 (ref 9–20)
BUN: 17 mg/dL (ref 6–24)
Bilirubin Total: 0.3 mg/dL (ref 0.0–1.2)
CO2: 22 mmol/L (ref 20–29)
Calcium: 9.9 mg/dL (ref 8.7–10.2)
Chloride: 103 mmol/L (ref 96–106)
Creatinine, Ser: 1.32 mg/dL — ABNORMAL HIGH (ref 0.76–1.27)
Globulin, Total: 2.6 g/dL (ref 1.5–4.5)
Glucose: 104 mg/dL — ABNORMAL HIGH (ref 70–99)
Potassium: 3.9 mmol/L (ref 3.5–5.2)
Sodium: 142 mmol/L (ref 134–144)
Total Protein: 7.2 g/dL (ref 6.0–8.5)
eGFR: 64 mL/min/{1.73_m2} (ref >59)

## 2022-10-13 LAB — CBC WITH DIFFERENTIAL/PLATELET
Basophils Absolute: 0 10*3/uL (ref 0.0–0.2)
Basos: 0 %
EOS (ABSOLUTE): 0.1 10*3/uL (ref 0.0–0.4)
Eos: 3 %
Hematocrit: 41.9 % (ref 37.5–51.0)
Hemoglobin: 14.1 g/dL (ref 13.0–17.7)
Immature Grans (Abs): 0 10*3/uL (ref 0.0–0.1)
Immature Granulocytes: 0 %
Lymphocytes Absolute: 1.5 10*3/uL (ref 0.7–3.1)
Lymphs: 34 %
MCH: 27.9 pg (ref 26.6–33.0)
MCHC: 33.7 g/dL (ref 31.5–35.7)
MCV: 83 fL (ref 79–97)
Monocytes Absolute: 0.3 10*3/uL (ref 0.1–0.9)
Monocytes: 6 %
Neutrophils Absolute: 2.6 10*3/uL (ref 1.4–7.0)
Neutrophils: 57 %
Platelets: 195 10*3/uL (ref 150–450)
RBC: 5.05 x10E6/uL (ref 4.14–5.80)
RDW: 12.3 % (ref 11.6–15.4)
WBC: 4.5 10*3/uL (ref 3.4–10.8)

## 2022-10-13 LAB — HIV ANTIBODY (ROUTINE TESTING W REFLEX): HIV Screen 4th Generation wRfx: NONREACTIVE

## 2022-10-13 LAB — HEPATITIS B SURFACE ANTIBODY,QUALITATIVE: Hep B Surface Ab, Qual: NONREACTIVE

## 2022-10-13 LAB — HEPATITIS C ANTIBODY: Hep C Virus Ab: NONREACTIVE

## 2022-10-16 ENCOUNTER — Ambulatory Visit
Admission: RE | Admit: 2022-10-16 | Discharge: 2022-10-16 | Disposition: A | Discharge status: Home / Self Care | Payer: BC Managed Care – PPO | Attending: Urology | Admitting: Urology

## 2022-10-16 ENCOUNTER — Ambulatory Visit: Payer: BC Managed Care – PPO | Admitting: Urology

## 2022-10-16 ENCOUNTER — Ambulatory Visit
Admission: RE | Admit: 2022-10-16 | Discharge: 2022-10-16 | Disposition: A | Discharge status: Home / Self Care | Payer: BC Managed Care – PPO | Source: Ambulatory Visit | Attending: Urology | Admitting: Urology

## 2022-10-16 ENCOUNTER — Encounter: Payer: Self-pay | Admitting: Urology

## 2022-10-16 VITALS — BP 165/96 | HR 79 | Ht 68.0 in | Wt 182.0 lb

## 2022-10-16 DIAGNOSIS — Z09 Encounter for follow-up examination after completed treatment for conditions other than malignant neoplasm: Secondary | ICD-10-CM | POA: Diagnosis not present

## 2022-10-16 DIAGNOSIS — N200 Calculus of kidney: Secondary | ICD-10-CM | POA: Diagnosis not present

## 2022-10-16 DIAGNOSIS — Z87442 Personal history of urinary calculi: Secondary | ICD-10-CM

## 2022-10-16 DIAGNOSIS — N201 Calculus of ureter: Secondary | ICD-10-CM | POA: Diagnosis not present

## 2022-10-16 NOTE — Progress Notes (Signed)
I, Amy L Pierron,acting as a scribe for Vanna Scotland, MD.,have documented all relevant documentation on the behalf of Vanna Scotland, MD,as directed by  Vanna Scotland, MD while in the presence of Vanna Scotland, MD.  /16/2024 4:59 PM   Ronald Sweeney [birth year] [id number]  Referring provider: Smitty Cords, DO [address] [place],  [place] [address]  Chief Complaint  Patient presents with   Follow-up    HPI: 57 year-old male with a personal history of nephrolithiasis returns today for annual follow-up with KUB.   He was seen last year with an obstructing 8 mm left proximal ureteral calculus. He underwent shockwave lithotripsy which wasn't effective. He ended up having ureteroscopy; noted to have a heavily impacted stone.  He has a punctate stone in his left lower pole which is smaller than the previous year.  He reports being well overall and has lost some weight from eating well and exercise. He does have some concerns about his blood pressure and the medications to regulate it gave him negative side effects. He is concerned the hypertension may affect his kidneys. He drinks a lot of water.    PMH: Past Medical History:  Diagnosis Date   Allergy    seasonal   History of kidney stones    Hypertension     Surgical History: Past Surgical History:  Procedure Laterality Date   COLONOSCOPY WITH PROPOFOL N/A 11/29/2016   Procedure: COLONOSCOPY WITH PROPOFOL;  Surgeon: Midge Minium, MD;  [facility];  Service: Endoscopy;  Laterality: N/A;   CYSTOSCOPY/URETEROSCOPY/HOLMIUM LASER/STENT PLACEMENT Left 02/06/2021   Procedure: CYSTOSCOPY/URETEROSCOPY/HOLMIUM LASER/STENT PLACEMENT;  Surgeon: Vanna Scotland, MD;  Location: [facility];  Service: Urology;  Laterality: Left;   EXTRACORPOREAL SHOCK WAVE LITHOTRIPSY Left 12/01/2020   Procedure: EXTRACORPOREAL SHOCK WAVE LITHOTRIPSY (ESWL);  Surgeon: Riki Altes, MD;  Location: [facility];  Service: Urology;   Laterality: Left;    Home Medications:  Allergies as of 10/16/2022       Reactions   Shellfish Allergy Shortness Of Breath, Swelling        Medication List        Accurate as of October 16, 2022  4:59 PM. If you have any questions, ask your nurse or doctor.          multivitamin with minerals tablet Take 1 tablet by mouth daily.   valsartan 160 MG tablet Commonly known as: DIOVAN Take 1 tablet (160 mg total) by mouth daily. What changed: how much to take        Allergies:  Allergies  Allergen Reactions   Shellfish Allergy Shortness Of Breath and Swelling    Family History: Family History  Problem Relation Age of Onset   Cancer Mother        colon cancer   Colon polyps Mother     Social History:  reports that he has never smoked. He has never used smokeless tobacco. He reports current alcohol use. He reports that he does not use drugs.   Physical Exam: BP (!) 165/96   Pulse 79   Ht  (1.727 m)   Wt 182 lb (82.6 kg)   BMI 27.67 kg/m   Constitutional:  Alert and oriented, No acute distress. HEENT: [place] AT, moist mucus membranes.  Trachea midline, no masses. Neurologic: Grossly intact, no focal deficits, moving all 4 extremities. Psychiatric: Normal mood and affect.  Pertinent Imaging: KUB done today and images personally reviewed. Awaiting final radiologic interpretation.    Assessment &  Plan:    History of kidney stones  - KUB today showed improvement over last year. Appears he has passed all the dust material as well.   - Blood pressure high today which he is aware of and actively working on getting under control by diet, exercise, and medications.  Return if symptoms worsen or fail to improve.   [facility] [address], [address] [place], [place] [address] [phone]

## 2022-10-17 ENCOUNTER — Ambulatory Visit: Payer: BC Managed Care – PPO | Admitting: Family Medicine

## 2022-10-17 VITALS — BP 170/99 | HR 112 | Ht 68.0 in | Wt 180.0 lb

## 2022-10-17 DIAGNOSIS — I10 Essential (primary) hypertension: Secondary | ICD-10-CM

## 2022-10-17 DIAGNOSIS — R7309 Other abnormal glucose: Secondary | ICD-10-CM

## 2022-10-17 MED ORDER — VALSARTAN 80 MG PO TABS
80.0000 mg | ORAL_TABLET | Freq: Every day | ORAL | 2 refills | Status: DC
Start: 2022-10-17 — End: 2023-01-14

## 2022-10-17 MED ORDER — AMLODIPINE BESYLATE 5 MG PO TABS
5.0000 mg | ORAL_TABLET | Freq: Every day | ORAL | 2 refills | Status: DC
Start: 2022-10-17 — End: 2023-01-14

## 2022-10-17 NOTE — Patient Instructions (Addendum)
Thank you for coming to the office today.  New orders  Amlodipine  daily  Valsartan  daily  30 day with refills  Keep BP readings on the Omron Cuff   Recent Labs    10/11/22 1530  HGBA1C 5.8*    Diet Recommendations for Preventing Diabetes   REDUCE Starchy (carb) foods include: Bread, rice, pasta, potatoes, corn, crackers, bagels, muffins, all baked goods.   FRUITS - LIMIT these HIGH sugar/carb fruits = Pineapple, Watermelon, Bananas - OKAY with these MEDIUM sugar/carb fruits = Citrus, Oranges, Grapes - PREFER these LOW sugar/carb fruits = Apples, Berries, Pears, Plums  Protein foods include: Meat, fish, poultry, eggs, dairy foods, and beans such as pinto and kidney beans (beans also provide carbohydrate).   1. Eat at least 3 meals and 1-2 snacks per day. Never go more than 4-5 hours while awake without eating.   2. Limit starchy foods to TWO per meal and ONE per snack. ONE portion of a starchy  food is equal to the following:   - ONE slice of bread (or its equivalent, such as half of a hamburger bun).   - 1/2 cup of a "scoopable" starchy food such as potatoes or rice.   - 1 OUNCE (28 grams) of starchy snacks (crackers or pretzels, look on label).   - 15 grams of carbohydrate as shown on food label.   3. Both lunch and dinner should include a protein food, a carb food, and vegetables.   - Obtain twice as many veg's as protein or carbohydrate foods for both lunch and dinner.   - Try to keep frozen veg's on hand for a quick vegetable serving.     - Fresh or frozen veg's are best.   4. Breakfast should always include protein.     Please schedule a Follow-up Appointment to: Return if symptoms worsen or fail to improve.  If you have any other questions or concerns, please feel free to call the office or send a message through MyChart. You may also schedule an earlier appointment if necessary.  Additionally, you may be receiving a survey about your experience at our  office within a few days to 1 week by e-mail or mail. We value your feedback.  Ronald Pilar, DO [facility] [place], [place]

## 2022-10-17 NOTE — Progress Notes (Signed)
Subjective:    Patient ID: Ronald Sweeney, male    DOB: 1965/07/17, 57 y.o.   MRN: [id number]  Ronald Sweeney is a 57 y.o. male presenting on 10/17/2022 for Hypertension   HPI  CHRONIC HTN: Last visit with me 09/05/22, has followed with [facility] Estelle Grumbles [facility]  Since last visit 08/2022 we increased dose from Valsartan  up to  He has been improving exercise and limiting sodium  He had itching side effect he thought either soap or the med. Believes it was the medication because when he reduced to HALF dose  he had leftover pills, the itching improved dramatically. Hypersensitivity with skin improved  Also Limited improvement on Valsartan  dose, readings were 150s  Previous therapies tried: amlodipine 10 mg (gum/leg swelling); lisinopril (ACEi-cough)   Reviewed chart. He was only ever on Amlodipine , not on lower dose.  He has quit drinking coffee previously He limits sodium content to [phone] per day max. He takes protein powders regularly that are low sodium Drinks mostly water   New Omron BP cuff arrived today, no readings yet      10/17/2022    4:14 PM 09/05/2022    4:08 PM 01/10/2022    4:09 PM  Depression screen PHQ 2/9  Decreased Interest 0 0 0  Down, Depressed, Hopeless 0 0 0  PHQ - 2 Score 0 0 0  Altered sleeping 2 1   Tired, decreased energy 0 0   Change in appetite 0 0   Feeling bad or failure about yourself  0 0   Trouble concentrating 0 0   Moving slowly or fidgety/restless 0 0   Suicidal thoughts 0 0   PHQ-9 Score 2 1   Difficult doing work/chores  Not difficult at all       10/17/2022    4:14 PM 09/05/2022    4:09 PM  GAD 7 : Generalized Anxiety Score  Nervous, Anxious, on Edge 0 0  Control/stop worrying 0 0  Worry too much - different things 0 1  Trouble relaxing 0 0  Restless 0 0  Easily annoyed or irritable 1 0  Afraid - awful might happen 0 0  Total GAD 7 Score 1 1  Anxiety Difficulty  Not difficult at  all      Social History   Tobacco Use   Smoking status: Never   Smokeless tobacco: Never  Substance Use Topics   Alcohol use: Yes    Alcohol/week: 0.0 standard drinks of alcohol    Comment: occasional   Drug use: No    Review of Systems Per HPI unless specifically indicated above     Objective:    BP (!) 170/99   Pulse (!) 112   Ht  (1.727 m)   Wt 180 lb (81.6 kg)   BMI 27.37 kg/m   Wt Readings from Last 3 Encounters:  10/17/22 180 lb (81.6 kg)  10/16/22 182 lb (82.6 kg)  09/05/22 187 lb 6.4 oz (85 kg)    Physical Exam Vitals and nursing note reviewed.  Constitutional:      General: He is not in acute distress.    Appearance: Normal appearance. He is well-developed. He is not diaphoretic.     Comments: Well-appearing, comfortable, cooperative  HENT:     Head: Normocephalic and atraumatic.  Eyes:     General:        Right eye: No discharge.        Left eye: No  discharge.     Conjunctiva/sclera: Conjunctivae normal.  Cardiovascular:     Rate and Rhythm: Normal rate.  Pulmonary:     Effort: Pulmonary effort is normal.  Skin:    General: Skin is warm and dry.     Findings: No erythema or rash.  Neurological:     Mental Status: He is alert and oriented to person, place, and time.  Psychiatric:        Mood and Affect: Mood normal.        Behavior: Behavior normal.        Thought Content: Thought content normal.     Comments: Well groomed, good eye contact, normal speech and thoughts       Results for orders placed or performed in visit on 10/12/22  HIV antibody (with reflex)  Result Value Ref Range   HIV Screen 4th Generation wRfx Non Reactive Non Reactive  Hepatitis B surface antibody,qualitative  Result Value Ref Range   Hep B Surface Ab, Qual Non Reactive   Hepatitis C Antibody  Result Value Ref Range   Hep C Virus Ab Non Reactive Non Reactive  CBC w/Diff  Result Value Ref Range   WBC 4.5 3.4 - 10.8 x10E3/uL   RBC 5.05 4.14 - 5.80  x10E6/uL   Hemoglobin 14.1 13.0 - 17.7 g/dL   Hematocrit 16.1 09.6 - 51.0 %   MCV 83 79 - 97 fL   MCH 27.9 26.6 - 33.0 pg   MCHC 33.7 31.5 - 35.7 g/dL   RDW 04.5 40.9 - 81.1 %   Platelets 195 150 - 450 x10E3/uL   Neutrophils 57 Not Estab. %   Lymphs 34 Not Estab. %   Monocytes 6 Not Estab. %   Eos 3 Not Estab. %   Basos 0 Not Estab. %   Neutrophils Absolute 2.6 1.4 - 7.0 x10E3/uL   Lymphocytes Absolute 1.5 0.7 - 3.1 x10E3/uL   Monocytes Absolute 0.3 0.1 - 0.9 x10E3/uL   EOS (ABSOLUTE) 0.1 0.0 - 0.4 x10E3/uL   Basophils Absolute 0.0 0.0 - 0.2 x10E3/uL   Immature Granulocytes 0 Not Estab. %   Immature Grans (Abs) 0.0 0.0 - 0.1 x10E3/uL  Comprehensive metabolic panel  Result Value Ref Range   Glucose 104 (H) 70 - 99 mg/dL   BUN 17 6 - 24 mg/dL   Creatinine, Ser 9.14 (H) 0.76 - 1.27 mg/dL   eGFR 64 >78 GN/FAO/1.30   BUN/Creatinine Ratio 13 9 - 20   Sodium 142 134 - 144 mmol/L   Potassium 3.9 3.5 - 5.2 mmol/L   Chloride 103 96 - 106 mmol/L   CO2 22 20 - 29 mmol/L   Calcium 9.9 8.7 - 10.2 mg/dL   Total Protein 7.2 6.0 - 8.5 g/dL   Albumin 4.6 3.8 - 4.9 g/dL   Globulin, Total 2.6 1.5 - 4.5 g/dL   Albumin/Globulin Ratio 1.8 1.2 - 2.2   Bilirubin Total 0.3 0.0 - 1.2 mg/dL   Alkaline Phosphatase 55 44 - 121 IU/L   AST 20 0 - 40 IU/L   ALT 21 0 - 44 IU/L  Specimen status report  Result Value Ref Range   specimen status report Comment       Assessment & Plan:   Problem List Items Addressed This Visit     Elevated hemoglobin A1c   Essential hypertension - Primary    Still Uncontrolled HYPERTENSION Failed Valsartan 160mg  due to itching hypersensitivity reaction  Failed Lisinopril ACEi-Cough, HCTZ/Losartan - uncertain why, Amlodipine 10mg  -  gum swelling    Plan:  Reduce dose of Valsartan from 160 to  daily, seems to tolerate  Restart Amlodipine, now at low dose  daily instead of   Keep up the good work with plans to limit salt caffeine and improve  lifestyle overall Discussion on importance of adherence to meds  Continue monitor BP w/ new Omron cuff  Keep phone f/u w clinical pharmacy next month  Other BP med considerations would include Thiazide (but he has failed combo therapy before) vs Beta blocker, such as Carvedilol, Nebivolol.  Expressed significant concern that given long history of difficulty with HYPERTENSION now we should consider pursuing referral to Advanced [facility]pert[facility] Clinic in [facility] w Dr Chilton Si in the future if still elevated      Relevant Medications   amLODipine (NORVASC) 5 MG tablet   valsartan (DIOVAN) 80 MG tablet    Additionally reviewed labs  Mild elevated A1c  5.8% Discussed pre diabetes risk and goal for low carb low starch Prior reading similar 6 yr ago  Mild elevated LDL  Otherwise labs normal  Meds ordered this encounter  Medications   amLODipine (NORVASC) 5 MG tablet    Sig: Take 1 tablet (5 mg total) by mouth daily.    Dispense:  30 tablet    Refill:  2   valsartan (DIOVAN) 80 MG tablet    Sig: Take 1 tablet (80 mg total) by mouth daily.    Dispense:  30 tablet    Refill:  2      Follow up plan: Return if symptoms worsen or fail to improve.   Saralyn Pilar, DO [facility] 10/17/2022, 4:37 PM

## 2022-10-17 NOTE — Assessment & Plan Note (Signed)
Still Uncontrolled HYPERTENSION Failed Valsartan  due to itching hypersensitivity reaction  Failed Lisinopril ACEi-Cough, HCTZ/Losartan - uncertain why, Amlodipine  - gum swelling    Plan:  Reduce dose of Valsartan from 160 to  daily, seems to tolerate  Restart Amlodipine, now at low dose  daily instead of   Keep up the good work with plans to limit salt caffeine and improve lifestyle overall Discussion on importance of adherence to meds  Continue monitor BP w/ new Omron cuff  Keep phone f/u w clinical pharmacy next month  Other BP med considerations would include Thiazide (but he has failed combo therapy before) vs Beta blocker, such as Carvedilol, Nebivolol.  Expressed significant concern that given long history of difficulty with HYPERTENSION now we should consider pursuing referral to [facility]pert[facility] Clinic in [facility] w Dr Chilton Si in the future if still elevated

## 2022-10-24 IMAGING — CR DG ABDOMEN 1V
2 series · 2 of 2 positions shown · non-contrast
Comparison: 01/13/2021

CLINICAL DATA: Left-sided renal calculi

EXAM:
ABDOMEN - 1 VIEW

[abdomen kub (1 of 2)]
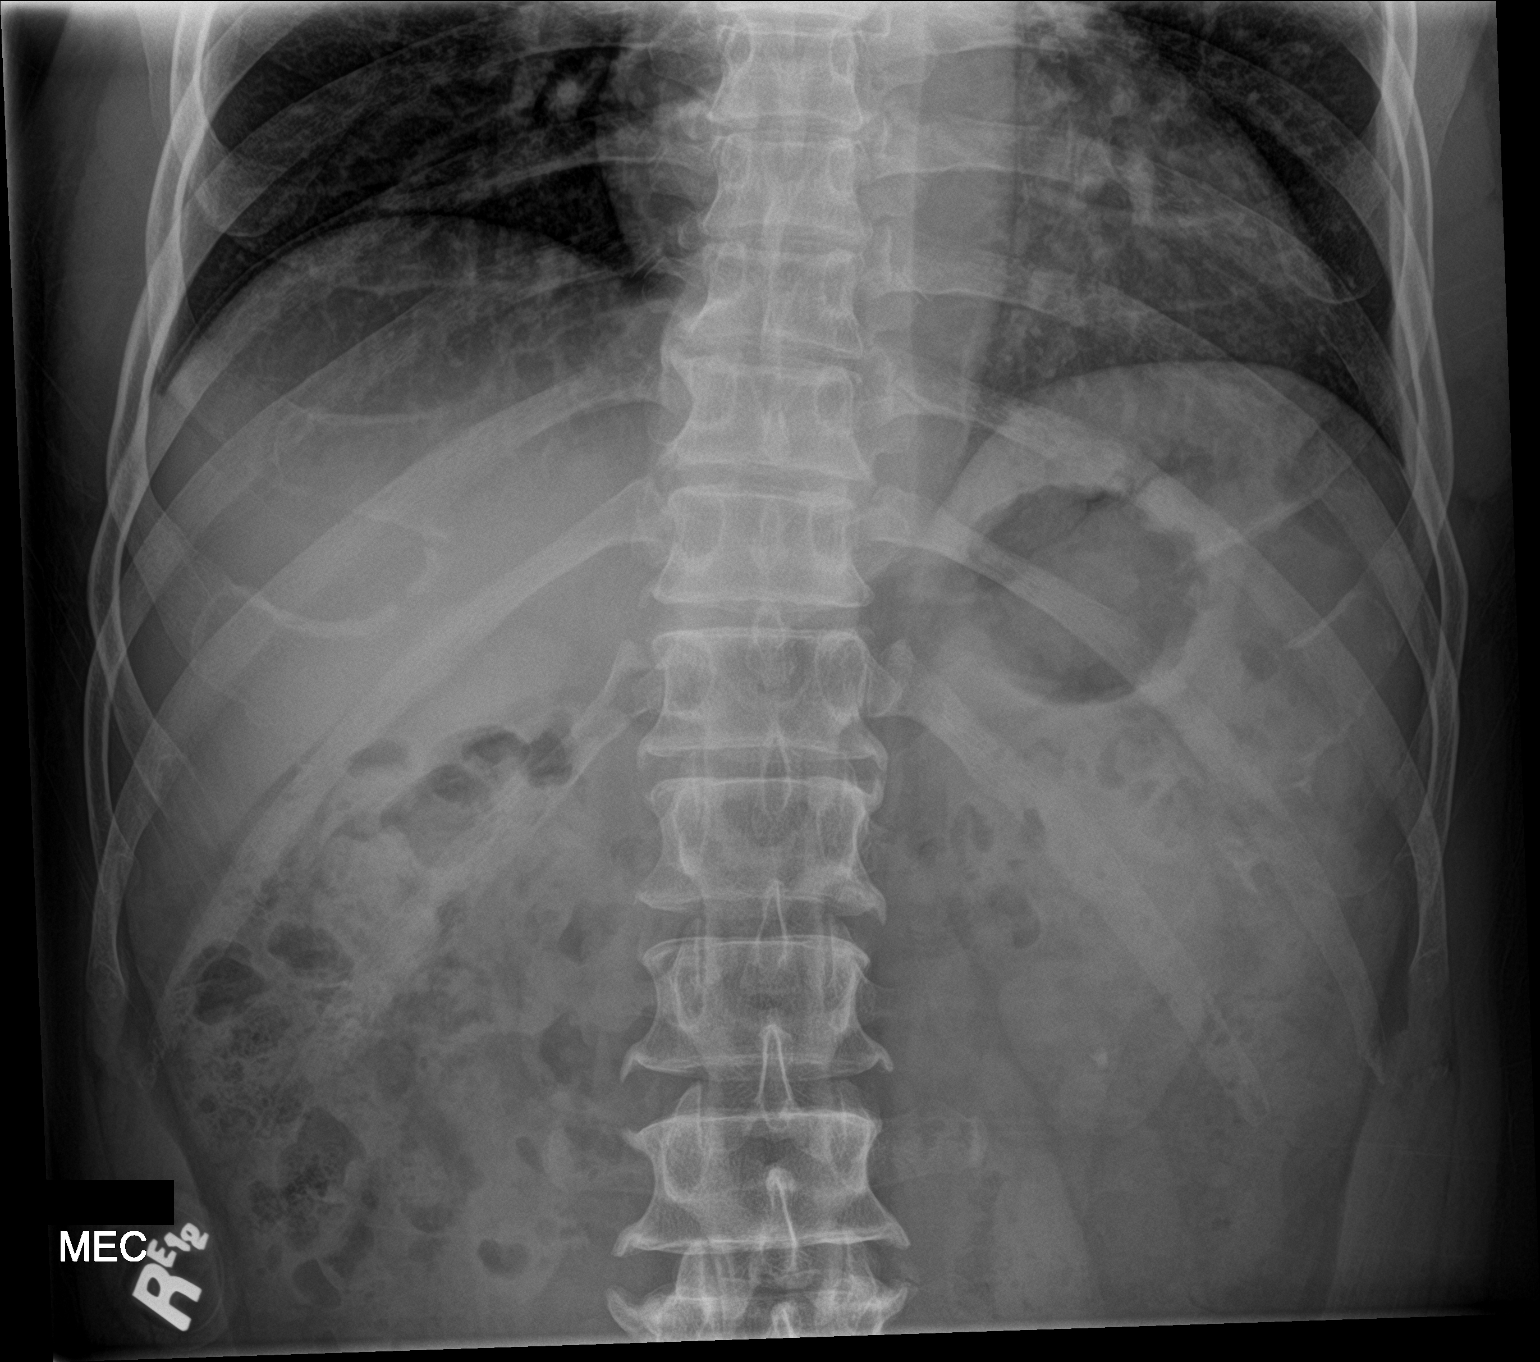

[abdomen kub (2 of 2)]
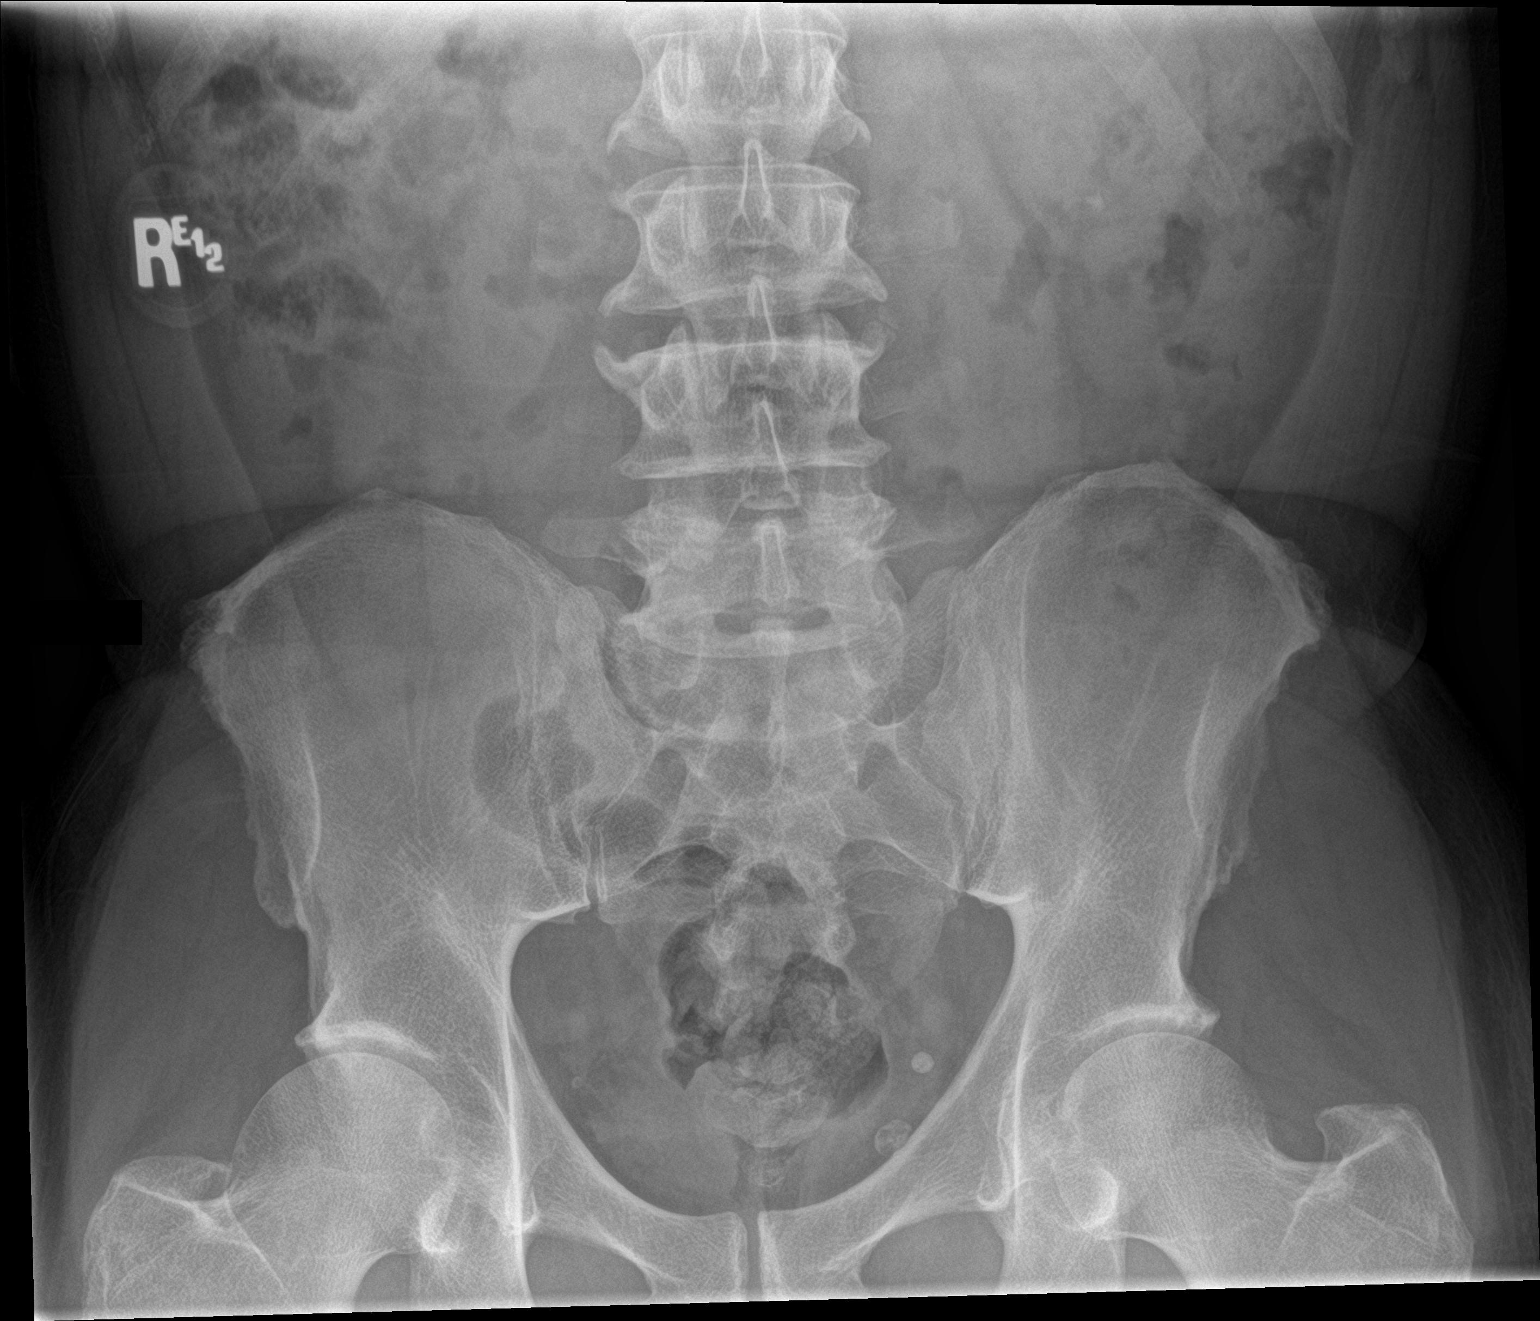

[2 of 2 positions shown; findings below may reference images not displayed]

FINDINGS: Lower pole left renal stone is noted measuring 5 mm. Previously seen
proximal left ureteral stone is no longer identified. Phleboliths
are noted within the pelvis. No acute bony abnormality is seen.
IMPRESSION: Stable small left lower pole renal stone.

## 2022-11-05 ENCOUNTER — Telehealth: Payer: Self-pay | Admitting: Pharmacist

## 2022-11-05 ENCOUNTER — Telehealth: Payer: BC Managed Care – PPO

## 2022-11-05 NOTE — Telephone Encounter (Signed)
   Outreach Note  11/05/2022 Name: Ronald Sweeney MRN: [id number] DOB: 17-Nov-1965  Referred by: Smitty Cords, DO Reason for referral : No chief complaint on file.   Receive a voicemail from patient today requesting to reschedule our appointment for today.   Follow Up Plan: Will collaborate with Care Guide to outreach to schedule follow up with me  Estelle Grumbles, PharmD, [facility] Clinical Pharmacist [facility] [phone]

## 2022-11-21 ENCOUNTER — Telehealth: Payer: Self-pay | Admitting: Pharmacist

## 2022-11-21 NOTE — Telephone Encounter (Signed)
   Outreach Note  11/21/2022 Name: Ronald Sweeney MRN: [id number] DOB: 08-Apr-1966  Referred by: Smitty Cords, DO Reason for referral : No chief complaint on file.   Attempt to reach patient by telephone today regarding need to reschedule upcoming appointment from 11/23/2022  Was unable to reach patient via telephone today and have left HIPAA compliant voicemail asking patient to return my call.   Follow Up Plan: Will collaborate with Care Guide to outreach to schedule follow up with me  Estelle Grumbles, PharmD, [facility] Clinical Pharmacist [facility] [phone]

## 2022-11-22 ENCOUNTER — Telehealth: Payer: Self-pay

## 2022-11-22 NOTE — Progress Notes (Signed)
  Care Coordination Note  11/22/2022 Name: Ronald Sweeney MRN: [id number] DOB: May 28, 1966  Ronald Sweeney is a 57 y.o. year old male who is a primary care patient of Smitty Cords, DO and is actively engaged with the Chronic Care Management team. I reached out to [name] by phone today to assist with re-scheduling a follow up visit with the Pharmacist  Follow up plan: Unsuccessful telephone outreach attempt made.  The care management team will reach out to the patient again over the next 7 days.  If patient returns call to provider office, please advise to call CCM Care Guide Penne Lash  at [phone]  Penne Lash, RMA Care Guide [facility]  [place], [place] [address] Direct Dial: [phone] [email or web address]

## 2022-11-23 ENCOUNTER — Telehealth: Payer: BC Managed Care – PPO

## 2022-12-25 NOTE — Progress Notes (Signed)
  Care Coordination Note  12/25/2022 Name: Ronald Sweeney MRN: [id number] DOB: 07/03/1965  Ronald Sweeney is a 57 y.o. year old male who is a primary care patient of Smitty Cords, DO and is actively engaged with the Chronic Care Management team. I reached out to [name] by phone today to assist with re-scheduling a follow up visit with the Pharmacist  Follow up plan: Telephone appointment with care management team member scheduled for:01/14/2023  Penne Lash, RMA Care Guide [facility]  [place], [place] [address] Direct Dial: [phone] [email or web address]

## 2023-01-13 ENCOUNTER — Other Ambulatory Visit: Payer: Self-pay | Admitting: Family Medicine

## 2023-01-13 DIAGNOSIS — I10 Essential (primary) hypertension: Secondary | ICD-10-CM

## 2023-01-14 ENCOUNTER — Encounter: Payer: Self-pay | Admitting: Pharmacist

## 2023-01-14 ENCOUNTER — Telehealth: Payer: BC Managed Care – PPO

## 2023-01-14 ENCOUNTER — Other Ambulatory Visit: Payer: Self-pay | Admitting: Family Medicine

## 2023-01-14 ENCOUNTER — Telehealth: Payer: Self-pay | Admitting: Pharmacist

## 2023-01-14 DIAGNOSIS — I10 Essential (primary) hypertension: Secondary | ICD-10-CM

## 2023-01-14 NOTE — Telephone Encounter (Signed)
Requested Prescriptions  Pending Prescriptions Disp Refills   amLODipine (NORVASC) 5 MG tablet [Pharmacy Med Name: AMLODIPINE BESYLATE 5MG  TABLETS] 90 tablet 1    Sig: TAKE 1 TABLET(5 MG) BY MOUTH DAILY     Cardiovascular: Calcium Channel Blockers 2 Failed - 01/13/2023  2:20 PM      Failed - Last BP in normal range    BP Readings from Last 1 Encounters:  10/17/22 (!) 170/99         Failed - Last Heart Rate in normal range    Pulse Readings from Last 1 Encounters:  10/17/22 (!) 112         Passed - Valid encounter within last 6 months    Recent Outpatient Visits           2 months ago Essential hypertension   [place] [facility] Ten[place], Netta Neat, DO   3 months ago Essential hypertension   [place] [facility] Delles, Gentry Fitz A, RPH-CPP   3 months ago Essential hypertension   [place] [facility] Delles, Gentry Fitz A, RPH-CPP   4 months ago Essential hypertension   [place] [facility] Smitty Cords, DO   4 months ago Essential hypertension   [place] [facility] Delles, Jackelyn Poling, RPH-CPP

## 2023-01-14 NOTE — Telephone Encounter (Signed)
   Outreach Note  01/14/2023 Name: TAYARI YANKEE MRN: [id number] DOB: 02-27-1966  Referred by: Smitty Cords, DO Reason for referral : No chief complaint on file.   Receive a voicemail from patient today requesting to reschedule our appointment for today.   Follow Up Plan: Will collaborate with Care Guide to outreach to schedule follow up with me  Estelle Grumbles, PharmD, [facility] Clinical Pharmacist [facility] [phone]

## 2023-01-14 NOTE — Telephone Encounter (Signed)
Requested Prescriptions  Pending Prescriptions Disp Refills   valsartan (DIOVAN) 80 MG tablet [Pharmacy Med Name: VALSARTAN 80MG  TABLETS] 90 tablet 0    Sig: TAKE 1 TABLET(80 MG) BY MOUTH DAILY     Cardiovascular:  Angiotensin Receptor Blockers Failed - 01/14/2023  3:31 AM      Failed - Cr in normal range and within 180 days    Creat  Date Value Ref Range Status  10/11/2022 1.25 0.70 - 1.30 mg/dL Final   Creatinine, Ser  Date Value Ref Range Status  10/12/2022 1.32 (H) 0.76 - 1.27 mg/dL Final         Failed - Last BP in normal range    BP Readings from Last 1 Encounters:  10/17/22 (!) 170/99         Passed - K in normal range and within 180 days    Potassium  Date Value Ref Range Status  10/12/2022 3.9 3.5 - 5.2 mmol/L Final         Passed - Patient is not pregnant      Passed - Valid encounter within last 6 months    Recent Outpatient Visits           2 months ago Essential hypertension   [place] [facility] Smitty Cords, DO   3 months ago Essential hypertension   [place] [facility] Delles, Gentry Fitz A, RPH-CPP   3 months ago Essential hypertension   [place] [facility] Delles, Gentry Fitz A, RPH-CPP   4 months ago Essential hypertension   [place] [facility] [place] Ann, Netta Neat, DO   4 months ago Essential hypertension   [place] [facility] Delles, Gentry Fitz A, RPH-CPP

## 2023-02-05 ENCOUNTER — Telehealth: Payer: Self-pay

## 2023-02-05 NOTE — Progress Notes (Signed)
  Care Coordination Note  02/05/2023 Name: JAQUAY LUCIO MRN: [id number] DOB: 21-Sep-1965  Riki ARVLE PRIDEAUX is a 57 y.o. year old male who is a primary care patient of Smitty Cords, DO and is actively engaged with the Chronic Care Management team. I reached out to [name] by phone today to assist with re-scheduling a follow up visit with the Pharmacist  Follow up plan: Unsuccessful telephone outreach attempt made. A HIPAA compliant phone message was left for the patient providing contact information and requesting a return call.  The care management team will reach out to the patient again over the next 7 days.  If patient returns call to provider office, please advise to call CCM Care Guide Penne Lash  at [phone]  Penne Lash, RMA Care Guide [facility]  [place], [place] [address] Direct Dial: [phone] [email or web address]

## 2023-03-05 NOTE — Progress Notes (Signed)
  Care Coordination Note  03/05/2023 Name: Ronald Sweeney MRN: [id number] DOB: [birth year]  Ronald Sweeney is a 57 y.o. year old male who is a primary care patient of Smitty Cords, DO and is actively engaged with the Chronic Care Management team. I reached out to [name] by phone today to assist with re-scheduling a follow up visit with the Pharmacist  Follow up plan: Unsuccessful telephone outreach attempt made. A HIPAA compliant phone message was left for the patient providing contact information and requesting a return call.  If patient returns call to provider office, please advise to call CCM Care Guide Penne Lash  at [phone]  Penne Lash, RMA Care Guide [facility]  [place], [place] [address] Direct Dial: [phone] [email or web address]

## 2023-03-20 NOTE — Progress Notes (Signed)
Care Coordination Note  03/20/2023 Name: Ronald Sweeney MRN: [id number] DOB: July 11, 1965  Airam KAYDRIAN HUNSAKER is a 57 y.o. year old male who is a primary care patient of Smitty Cords, DO and is actively engaged with the Chronic Care Management team. I reached out to [name] by phone today to assist with re-scheduling a follow up visit with the Pharmacist  Follow up plan: Unable to make contact on outreach attempts x 3. PCP Smitty Cords, DO notified via routed documentation in medical record.   Penne Lash, RMA Care Guide [facility]  [place], [place] [address] Direct Dial: [phone] [email or web address]

## 2023-03-23 ENCOUNTER — Other Ambulatory Visit: Payer: Self-pay | Admitting: Family Medicine

## 2023-03-23 DIAGNOSIS — I10 Essential (primary) hypertension: Secondary | ICD-10-CM

## 2023-03-25 NOTE — Telephone Encounter (Signed)
Unable to refill per protocol, Rx expired. Discontinued 10/17/22.  Requested Prescriptions  Pending Prescriptions Disp Refills   valsartan (DIOVAN) 160 MG tablet [Pharmacy Med Name: VALSARTAN 160MG  TABLETS] 90 tablet 1    Sig: TAKE 1 TABLET(160 MG) BY MOUTH DAILY     Cardiovascular:  Angiotensin Receptor Blockers Failed - 03/23/2023  9:48 AM      Failed - Cr in normal range and within 180 days    Creat  Date Value Ref Range Status  10/11/2022 1.25 0.70 - 1.30 mg/dL Final   Creatinine, Ser  Date Value Ref Range Status  10/12/2022 1.32 (H) 0.76 - 1.27 mg/dL Final         Failed - Last BP in normal range    BP Readings from Last 1 Encounters:  10/17/22 (!) 170/99         Passed - K in normal range and within 180 days    Potassium  Date Value Ref Range Status  10/12/2022 3.9 3.5 - 5.2 mmol/L Final         Passed - Patient is not pregnant      Passed - Valid encounter within last 6 months    Recent Outpatient Visits           5 months ago Essential hypertension   [place] [facility] Smitty Cords, DO   5 months ago Essential hypertension   [place] [facility] Delles, Gentry Fitz A, RPH-CPP   6 months ago Essential hypertension   [place] [facility] Delles, Gentry Fitz A, RPH-CPP   6 months ago Essential hypertension   [place] [facility] Smitty Cords, DO   6 months ago Essential hypertension   [place] [facility] Delles, Gentry Fitz A, RPH-CPP

## 2023-07-01 ENCOUNTER — Other Ambulatory Visit: Payer: Self-pay | Admitting: Family Medicine

## 2023-07-01 DIAGNOSIS — I10 Essential (primary) hypertension: Secondary | ICD-10-CM

## 2023-07-01 MED ORDER — VALSARTAN 80 MG PO TABS
80.0000 mg | ORAL_TABLET | Freq: Every day | ORAL | 0 refills | Status: DC
Start: 2023-07-01 — End: 2023-07-15

## 2023-07-15 ENCOUNTER — Other Ambulatory Visit: Payer: Self-pay | Admitting: Family Medicine

## 2023-07-15 DIAGNOSIS — I10 Essential (primary) hypertension: Secondary | ICD-10-CM

## 2023-07-15 MED ORDER — VALSARTAN 80 MG PO TABS
80.0000 mg | ORAL_TABLET | Freq: Every day | ORAL | 0 refills | Status: DC
Start: 2023-07-15 — End: 2024-02-04

## 2023-07-15 NOTE — Telephone Encounter (Signed)
 Patient's wife is requesting that patient's valsartan (DIOVAN) 80 MG tablet  be resent to a different pharmacy. [id number] [place], [place] - [address] AT [facility] [address] [facility] Phone: [phone]  Fax: [phone]

## 2023-07-15 NOTE — Telephone Encounter (Signed)
 Requested Prescriptions  Pending Prescriptions Disp Refills   valsartan  (DIOVAN ) 80 MG tablet 90 tablet 0    Sig: Take 1 tablet (80 mg total) by mouth daily.     Cardiovascular:  Angiotensin Receptor Blockers Failed - 07/15/2023 12:51 PM      Failed - Cr in normal range and within 180 days    Creat  Date Value Ref Range Status  10/11/2022 1.25 0.70 - 1.30 mg/dL Final   Creatinine, Ser  Date Value Ref Range Status  10/12/2022 1.32 (H) 0.76 - 1.27 mg/dL Final         Failed - K in normal range and within 180 days    Potassium  Date Value Ref Range Status  10/12/2022 3.9 3.5 - 5.2 mmol/L Final         Failed - Last BP in normal range    BP Readings from Last 1 Encounters:  10/17/22 (!) 170/99         Failed - Valid encounter within last 6 months    Recent Outpatient Visits           9 months ago Essential hypertension   [place] [facility] [place] Marsa PARAS, DO   9 months ago Essential hypertension   [place] [facility] Delles, Sharyle A, RPH-CPP   10 months ago Essential hypertension   [place] [facility] Delles, Sharyle A, RPH-CPP   10 months ago Essential hypertension   [place] [facility] [place] Marsa PARAS, DO   10 months ago Essential hypertension   [place] [facility] Delles, Sharyle LABOR, RPH-CPP              Passed - Patient is not pregnant

## 2023-11-01 ENCOUNTER — Encounter: Payer: Self-pay | Admitting: Family Medicine

## 2023-11-06 ENCOUNTER — Telehealth: Payer: Self-pay | Admitting: Pharmacist

## 2023-11-06 DIAGNOSIS — I10 Essential (primary) hypertension: Secondary | ICD-10-CM

## 2023-11-06 NOTE — Progress Notes (Signed)
   11/06/2023  Patient ID: Ronald Sweeney, male   DOB: Mar 06, 1966, 58 y.o.   MRN: [id number]  Receive a voicemail from patient requesting a call back to re-establish care  Return call to patient. Note patient scheduled for Office Visit with PCP on 11/20/2023 (labs on 5/14)  As requested, schedule follow up appointment   Patient advises that he is out of one of his 2 blood pressure medications, but unsure of which one. Requests renewal of prescriptions if needed.  Outreach to [employer]. Speak with [facility] pharmacy technician who reports pharmacy is able to fill prescriptions for patient for both: amlodipine  5 mg daily & valsartan  80 mg daily. - Follow up with patient to provide this update  Discuss importance of medication adherence.   Reviewed to check blood pressure daily, document, and provide at upcoming office visit with PCP, but to contact office sooner if readings outside established parameters or for any symptoms   Follow Up Plan: Clinical Pharmacist will follow up with patient by telephone on 12/02/2023 at 2:00 PM   Arthur Lash, PharmD, [facility] Clinical Pharmacist [facility] [phone]

## 2023-11-08 ENCOUNTER — Other Ambulatory Visit: Payer: Self-pay

## 2023-11-08 ENCOUNTER — Ambulatory Visit (INDEPENDENT_AMBULATORY_CARE_PROVIDER_SITE_OTHER): Payer: Self-pay | Admitting: Physician Assistant

## 2023-11-08 VITALS — BP 179/96 | HR 68 | Ht 67.0 in | Wt 185.0 lb

## 2023-11-08 DIAGNOSIS — I10 Essential (primary) hypertension: Secondary | ICD-10-CM

## 2023-11-08 NOTE — Progress Notes (Addendum)
 Therapist, [facility] [address] [place], [place] [address]   Office Visit Note  Patient Name: Ronald Sweeney Date of Birth [id number]  Medical Record number [id number]  Date of Service: 11/08/2023  Chief Complaint  Patient presents with   Blood Pressure Check    Patient stopped amlodipine  after having gum surgery because he states his dentist told him the medication was impeding the healing process. He restarted the medication about 2 days ago after getting a high reading at the dentist office. He states he has been in contact with his PCP and has a F/U scheduled for 5/14 and 5/21. Patient would excused from work today.     HPI Pt is here for a check up visit. States he stopped taking his amlodipine  after his dental assistant told him it was causing his gums not to heal after a procedure. Stopped taking it about 8mos ago. Then he had his last dental procedure last Thurs (8 days ago) and his BP was high so they informed him that he should restart the medicine.  He's only been back on his amlodipine  for 3 days (in addition to the Valsartan  he's been taking the whole time), has noticed his BP is still high.  Endorses the fact that he doesn't sleep enough at night.  Has an appt with his PCP on Tuesday for f/up after restarting his meds. Wants a work note for today, he's tired and thinks he needs rest. .  Denies any symptoms related to his BP (CP, SOB, abd pain/n/v, vision changes, lightheadedness, HA, numbness/tingling/weakness, etc).   ROS: Review of Systems  Eyes:  Negative for visual disturbance.  Respiratory:  Negative for shortness of breath.   Cardiovascular:  Negative for chest pain.  Gastrointestinal:  Negative for abdominal pain, nausea and vomiting.  Musculoskeletal:  Negative for arthralgias and myalgias.  Neurological:  Negative for weakness, light-headedness, numbness and headaches.     Current Medication:  Outpatient Encounter Medications as of 11/08/2023  Medication  Sig   amLODipine  (NORVASC ) 5 MG tablet TAKE 1 TABLET(5 MG) BY MOUTH DAILY   Multiple Vitamins-Minerals (MULTIVITAMIN WITH MINERALS) tablet Take 1 tablet by mouth daily.   valsartan  (DIOVAN ) 80 MG tablet Take 1 tablet (80 mg total) by mouth daily.   No facility-administered encounter medications on file as of 11/08/2023.      Medical History: Past Medical History:  Diagnosis Date   Allergy    seasonal   History of kidney stones    Hypertension      Vital Signs: BP (!) 179/96   Pulse 68   Ht 5\' 7"  (1.702 m)   Wt 83.9 kg   SpO2 99%   BMI 28.98 kg/m    Physical Exam Vitals and nursing note reviewed.  Constitutional:      General: He is not in acute distress.    Appearance: Normal appearance. He is well-developed. He is not toxic-appearing.     Comments: Afebrile, nontoxic, NAD  HENT:     Head: Normocephalic and atraumatic.     Mouth/Throat:     Mouth: Mucous membranes are moist.  Eyes:     General:        Right eye: No discharge.        Left eye: No discharge.     Conjunctiva/sclera: Conjunctivae normal.  Cardiovascular:     Rate and Rhythm: Normal rate.     Pulses: Normal pulses.  Pulmonary:     Effort: Pulmonary effort is normal. No respiratory  distress.  Abdominal:     General: There is no distension.  Musculoskeletal:        General: Normal range of motion.     Cervical back: Normal range of motion and neck supple.  Skin:    General: Skin is warm and dry.     Findings: No rash.  Neurological:     Mental Status: He is alert and oriented to person, place, and time.     Sensory: Sensation is intact. No sensory deficit.     Motor: Motor function is intact.  Psychiatric:        Mood and Affect: Affect normal. Mood is anxious.        Behavior: Behavior normal.     Comments: A little anxious       Assessment/Plan:   ICD-10-CM   1. Hypertension, unspecified type  I10        Pt here with HTN d/t stopping amlodipine  a few months ago. Only restarted a  couple days ago. BP high but no concerning symptoms, doubt need for emergent intervention. Continue taking meds as prescribed, f/up with PCP at already scheduled appt. Get plenty of rest, drink lots of water , low salt diet. Strict ED/return precautions given. Work note given.   General Counseling: Ravi verbalizes understanding of the findings of todays visit and agrees with plan of treatment. I have discussed any further diagnostic evaluation that may be needed or ordered today. We also reviewed his medications today. he has been encouraged to call the office with any questions or concerns that should arise related to todays visit.   No orders of the defined types were placed in this encounter.  No results found for this or any previous visit (from the past 24 hours).   No orders of the defined types were placed in this encounter.   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

## 2023-11-12 ENCOUNTER — Other Ambulatory Visit: Payer: Self-pay

## 2023-11-12 ENCOUNTER — Other Ambulatory Visit: Payer: Self-pay | Admitting: Family Medicine

## 2023-11-12 DIAGNOSIS — I10 Essential (primary) hypertension: Secondary | ICD-10-CM

## 2023-11-12 DIAGNOSIS — Z125 Encounter for screening for malignant neoplasm of prostate: Secondary | ICD-10-CM

## 2023-11-12 DIAGNOSIS — Z87898 Personal history of other specified conditions: Secondary | ICD-10-CM

## 2023-11-12 DIAGNOSIS — E7800 Pure hypercholesterolemia, unspecified: Secondary | ICD-10-CM | POA: Insufficient documentation

## 2023-11-12 DIAGNOSIS — R7309 Other abnormal glucose: Secondary | ICD-10-CM

## 2023-11-12 DIAGNOSIS — Z0000 Encounter for general adult medical examination without abnormal findings: Secondary | ICD-10-CM

## 2023-11-13 ENCOUNTER — Other Ambulatory Visit: Payer: Self-pay

## 2023-11-13 ENCOUNTER — Other Ambulatory Visit

## 2023-11-13 DIAGNOSIS — I10 Essential (primary) hypertension: Secondary | ICD-10-CM

## 2023-11-13 DIAGNOSIS — E7800 Pure hypercholesterolemia, unspecified: Secondary | ICD-10-CM

## 2023-11-13 DIAGNOSIS — Z0000 Encounter for general adult medical examination without abnormal findings: Secondary | ICD-10-CM

## 2023-11-13 DIAGNOSIS — Z87898 Personal history of other specified conditions: Secondary | ICD-10-CM

## 2023-11-13 DIAGNOSIS — Z125 Encounter for screening for malignant neoplasm of prostate: Secondary | ICD-10-CM

## 2023-11-13 DIAGNOSIS — R7309 Other abnormal glucose: Secondary | ICD-10-CM

## 2023-11-14 LAB — URINALYSIS, ROUTINE W REFLEX MICROSCOPIC
Bilirubin, UA: NEGATIVE
Glucose, UA: NEGATIVE
Ketones, UA: NEGATIVE
Leukocytes,UA: NEGATIVE
Nitrite, UA: NEGATIVE
Protein,UA: NEGATIVE
RBC, UA: NEGATIVE
Specific Gravity, UA: 1.014 (ref 1.005–1.030)
Urobilinogen, Ur: 0.2 mg/dL (ref 0.2–1.0)
pH, UA: 6.5 (ref 5.0–7.5)

## 2023-11-14 LAB — CBC WITH DIFFERENTIAL/PLATELET
Basophils Absolute: 0 10*3/uL (ref 0.0–0.2)
Basos: 0 %
EOS (ABSOLUTE): 0.1 10*3/uL (ref 0.0–0.4)
Eos: 2 %
Hematocrit: 46.5 % (ref 37.5–51.0)
Hemoglobin: 15.2 g/dL (ref 13.0–17.7)
Immature Grans (Abs): 0 10*3/uL (ref 0.0–0.1)
Immature Granulocytes: 0 %
Lymphocytes Absolute: 2.1 10*3/uL (ref 0.7–3.1)
Lymphs: 35 %
MCH: 28 pg (ref 26.6–33.0)
MCHC: 32.7 g/dL (ref 31.5–35.7)
MCV: 86 fL (ref 79–97)
Monocytes Absolute: 0.3 10*3/uL (ref 0.1–0.9)
Monocytes: 5 %
Neutrophils Absolute: 3.5 10*3/uL (ref 1.4–7.0)
Neutrophils: 58 %
Platelets: 215 10*3/uL (ref 150–450)
RBC: 5.43 x10E6/uL (ref 4.14–5.80)
RDW: 12.5 % (ref 11.6–15.4)
WBC: 6.1 10*3/uL (ref 3.4–10.8)

## 2023-11-14 LAB — COMPREHENSIVE METABOLIC PANEL WITH GFR
ALT: 29 IU/L (ref 0–44)
AST: 25 IU/L (ref 0–40)
Albumin: 4.8 g/dL (ref 3.8–4.9)
Alkaline Phosphatase: 60 IU/L (ref 44–121)
BUN/Creatinine Ratio: 12 (ref 9–20)
BUN: 16 mg/dL (ref 6–24)
Bilirubin Total: 0.4 mg/dL (ref 0.0–1.2)
CO2: 25 mmol/L (ref 20–29)
Calcium: 9.9 mg/dL (ref 8.7–10.2)
Chloride: 99 mmol/L (ref 96–106)
Creatinine, Ser: 1.3 mg/dL — ABNORMAL HIGH (ref 0.76–1.27)
Globulin, Total: 2.5 g/dL (ref 1.5–4.5)
Glucose: 104 mg/dL — ABNORMAL HIGH (ref 70–99)
Potassium: 4.2 mmol/L (ref 3.5–5.2)
Sodium: 139 mmol/L (ref 134–144)
Total Protein: 7.3 g/dL (ref 6.0–8.5)
eGFR: 64 mL/min/{1.73_m2} (ref >59)

## 2023-11-14 LAB — TSH: TSH: 2.05 u[IU]/mL (ref 0.450–4.500)

## 2023-11-14 LAB — LIPID PANEL
Chol/HDL Ratio: 2.6 ratio (ref 0.0–5.0)
Cholesterol, Total: 224 mg/dL — ABNORMAL HIGH (ref 100–199)
HDL: 85 mg/dL (ref >39)
LDL Chol Calc (NIH): 127 mg/dL — ABNORMAL HIGH (ref 0–99)
Triglycerides: 67 mg/dL (ref 0–149)
VLDL Cholesterol Cal: 12 mg/dL (ref 5–40)

## 2023-11-14 LAB — PSA: Prostate Specific Ag, Serum: 2.8 ng/mL (ref 0.0–4.0)

## 2023-11-14 LAB — HEMOGLOBIN A1C
Est. average glucose Bld gHb Est-mCnc: 123 mg/dL
Hgb A1c MFr Bld: 5.9 % — ABNORMAL HIGH (ref 4.8–5.6)

## 2023-11-20 ENCOUNTER — Ambulatory Visit (INDEPENDENT_AMBULATORY_CARE_PROVIDER_SITE_OTHER): Admitting: Family Medicine

## 2023-11-20 ENCOUNTER — Encounter: Payer: Self-pay | Admitting: Family Medicine

## 2023-11-20 VITALS — BP 170/90 | HR 92 | Ht 67.0 in | Wt 182.1 lb

## 2023-11-20 DIAGNOSIS — I10 Essential (primary) hypertension: Secondary | ICD-10-CM | POA: Diagnosis not present

## 2023-11-20 DIAGNOSIS — Z0000 Encounter for general adult medical examination without abnormal findings: Secondary | ICD-10-CM

## 2023-11-20 DIAGNOSIS — Z125 Encounter for screening for malignant neoplasm of prostate: Secondary | ICD-10-CM | POA: Diagnosis not present

## 2023-11-20 DIAGNOSIS — R972 Elevated prostate specific antigen [PSA]: Secondary | ICD-10-CM

## 2023-11-20 DIAGNOSIS — R7303 Prediabetes: Secondary | ICD-10-CM | POA: Insufficient documentation

## 2023-11-20 MED ORDER — NEBIVOLOL HCL 5 MG PO TABS: 5.0000 mg | ORAL_TABLET | Freq: Every day | ORAL | 2 refills | Status: DC

## 2023-11-20 NOTE — Patient Instructions (Addendum)
 Thank you for coming to the office today.  1 new BP medication  Nebivolol (Bystolic ) 5mg  daily  Keep current meds with Amlodipine  and Valsartan   Recent Labs    11/13/23 0735  HGBA1C 5.9*    Diet Recommendations for Preventing Diabetes   REDUCE Starchy (carb) foods include: Bread, rice, pasta, potatoes, corn, crackers, bagels, muffins, all baked goods.   FRUITS - LIMIT these HIGH sugar/carb fruits = Pineapple, Watermelon, Bananas - OKAY with these MEDIUM sugar/carb fruits = Citrus, Oranges, Grapes - PREFER these LOW sugar/carb fruits = Apples, Berries, Pears, Plums  Protein foods include: Meat, fish, poultry, eggs, dairy foods, and beans such as pinto and kidney beans (beans also provide carbohydrate).   1. Eat at least 3 meals and 1-2 snacks per day. Never go more than 4-5 hours while awake without eating.   2. Limit starchy foods to TWO per meal and ONE per snack. ONE portion of a starchy  food is equal to the following:   - ONE slice of bread (or its equivalent, such as half of a hamburger bun).   - 1/2 cup of a "scoopable" starchy food such as potatoes or rice.   - 1 OUNCE (28 grams) of starchy snacks (crackers or pretzels, look on label).   - 15 grams of carbohydrate as shown on food label.   3. Both lunch and dinner should include a protein food, a carb food, and vegetables.   - Obtain twice as many veg's as protein or carbohydrate foods for both lunch and dinner.   - Try to keep frozen veg's on hand for a quick vegetable serving.     - Fresh or frozen veg's are best.   4. Breakfast should always include protein.    -----------------  FUTURE Consideration Coronary Calcium Score Cardiac CT Scan. This is a screening test for patients aged 76-50+ with cardiovascular risk factors or who are healthy but would be interested in Cardiovascular Screening for heart disease. Even if there is a family history of heart disease, this imaging can be useful. Typically it can be  done every 5+ years or at a different timeline we agree on  The scan will look at the chest and mainly focus on the heart and identify early signs of calcium build up or blockages within the heart arteries. It is not 100% accurate for identifying blockages or heart disease, but it is useful to help us  predict who may have some early changes or be at risk in the future for a heart attack or cardiovascular problem.  The results are reviewed by a Cardiologist and they will document the results. It should become available on MyChart. Typically the results are divided into percentiles based on other patients of the same demographic and age. So it will compare your risk to others similar to you. If you have a higher score >99 or higher percentile >75%tile, it is recommended to consider Statin cholesterol therapy and or referral to Cardiologist. I will try to help explain your results and if we have questions we can contact the Cardiologist.  You will be contacted for scheduling. Usually it is done at any imaging facility through [facility], [facility] or [facility].  The cost is $99 flat fee total and it does not go through insurance, so no authorization is required.  -------------------------  Try the Melatonin 5mg  nightly is a good dose for improving sleep.  Sleep Hygiene Recommendations to promote healthy sleep in all patients, especially  if symptoms of insomnia are worsening. Due to the nature of sleep rhythms, if your body gets "out of rhythm", it may take some time before your sleep cycle can be "reset".  Please try to follow as many of the following tips as you can, usually there are only a few of these are the primary cause of the problem.  ?To reset your sleep rhythm, go to bed and get up at the same time every day ?Sleep only long enough to feel rested and then get out of bed ?Do not try to force yourself to sleep. If you can't sleep, get out of bed and try  again later. ?Avoid naps during the day, unless excessively tired. The more sleeping during the day, then the less sleep your body needs at night.  ?Have coffee, tea, and other foods that have caffeine only in the morning ?Exercise several days a week, but not right before bed ?If you drink alcohol, prefer to have appropriate drink with one meal, but prefer to avoid alcohol in the evening, and bedtime ?If you smoke, avoid smoking, especially in the evening  ?Avoid watching TV or looking at phones, computers, or reading devices ("e-books") that give off light at least 30 minutes before bed. This artificial light sends "awake signals" to your brain and can make it harder to fall asleep. ?Make your bedroom a comfortable place where it is easy to fall asleep: Put up shades or special blackout curtains to block light from outside. Use a white noise machine to block noise. Keep the temperature cool. ?Try your best to solve or at least address your problems before you go to bed ?Use relaxation techniques to manage stress. Ask your health care provider to suggest some techniques that may work well for you. These may include: Breathing exercises. Routines to release muscle tension. Visualizing peaceful scenes.    Please schedule a Follow-up Appointment to: Return in about 3 months (around 02/20/2024), or 3 month follow-up HTN, for 3 month follow-up HTN.  If you have any other questions or concerns, please feel free to call the office or send a message through MyChart. You may also schedule an earlier appointment if necessary.  Additionally, you may be receiving a survey about your experience at our office within a few days to 1 week by e-mail or mail. We value your feedback.  Domingo Friend, DO [facility], [place]

## 2023-11-20 NOTE — Progress Notes (Signed)
 Subjective:    Patient ID: Ronald Sweeney, male    DOB: 1965/07/20, 58 y.o.   MRN: [id number]  Ronald Sweeney is a 58 y.o. male presenting on 11/20/2023 for Hypertension and Annual Exam   HPI  Discussed the use of AI scribe software for clinical note transcription with the patient, who gave verbal consent to proceed.  History of Present Illness   Ronald Sweeney is a 58 year old male with hypertension who presents for an annual physical exam.     CHRONIC HTN: Last visit 1 year ago. He has been lost to follow-up. History of uncontrolled BP He has a history of hypertension and is currently taking valsartan  and amlodipine . He discontinued amlodipine  about eight months ago due to concerns about gum healing after oral surgery but resumed it recently after experiencing elevated blood pressure readings during his last surgery on May 1st, 2025. His blood pressure has been fluctuating, with recent readings as high as 175/102. He monitors his blood pressure at home.   He has continued efforts to improve lifestyle. He limits sodium, inc water , improved exercise Previuosly Working with clinical pharmacy with improvement of BP 130-140s  Continues Valsartan  80mg  daily and has since restarted Amlodipine  5mg  daily Current home BP readings BP 150-170s Next call with Timoteo Force in June 2025  Insomnia Poor sleep hygiene, late bedtime, wake up early He experiences sleep deprivation, averaging about four hours of sleep per night due to his work schedule, which he believes may be contributing to his elevated blood pressure.   Elevated A1c Recent lab results show a slight increase in A1c from 5.8 to 5.9, indicating prediabetes. He acknowledges a diet high in starches and sugars, including frequent consumption of bananas and pineapples.  Hyperlipidemia His cholesterol levels have increased from an LDL of 113 to 127 over the past year. His diet includes high-fat foods. Not on Statin  Health  Maintenance:  PSA 2.8 10/2023, prior history PSA 1.7 (1 year ago) - Recommend to repeat in 6 months He has some nocturia 1-2 times overnight. No known family history.  Colonoscopy 11/29/16, Dr Ole Berkeley, negative, repeat in 10 years, 2028.     10/17/2022    4:14 PM 09/05/2022    4:08 PM 01/10/2022    4:09 PM  Depression screen PHQ 2/9  Decreased Interest 0 0 0  Down, Depressed, Hopeless 0 0 0  PHQ - 2 Score 0 0 0  Altered sleeping 2 1   Tired, decreased energy 0 0   Change in appetite 0 0   Feeling bad or failure about yourself  0 0   Trouble concentrating 0 0   Moving slowly or fidgety/restless 0 0   Suicidal thoughts 0 0   PHQ-9 Score 2 1   Difficult doing work/chores  Not difficult at all        10/17/2022    4:14 PM 09/05/2022    4:09 PM  GAD 7 : Generalized Anxiety Score  Nervous, Anxious, on Edge 0 0  Control/stop worrying 0 0  Worry too much - different things 0 1  Trouble relaxing 0 0  Restless 0 0  Easily annoyed or irritable 1 0  Afraid - awful might happen 0 0  Total GAD 7 Score 1 1  Anxiety Difficulty  Not difficult at all     Past Medical History:  Diagnosis Date   Allergy    seasonal   History of kidney stones    Hypertension  Past Surgical History:  Procedure Laterality Date   COLONOSCOPY WITH PROPOFOL  N/A 11/29/2016   Procedure: COLONOSCOPY WITH PROPOFOL ;  Surgeon: Marnee Sink, MD;  Location: [facility];  Service: Endoscopy;  Laterality: N/A;   CYSTOSCOPY/URETEROSCOPY/HOLMIUM LASER/STENT PLACEMENT Left 02/06/2021   Procedure: CYSTOSCOPY/URETEROSCOPY/HOLMIUM LASER/STENT PLACEMENT;  Surgeon: Dustin Gimenez, MD;  Location: [facility];  Service: Urology;  Laterality: Left;   EXTRACORPOREAL SHOCK WAVE LITHOTRIPSY Left 12/01/2020   Procedure: EXTRACORPOREAL SHOCK WAVE LITHOTRIPSY (ESWL);  Surgeon: Geraline Knapp, MD;  Location: [facility];  Service: Urology;  Laterality: Left;   Social History   Socioeconomic History   Marital status: Married     Spouse name: Not on file   Number of children: 1   Years of education: Not on file   Highest education level: Some college, no degree  Occupational History   Not on file  Tobacco Use   Smoking status: Never   Smokeless tobacco: Never  Substance and Sexual Activity   Alcohol use: Yes    Alcohol/week: 0.0 standard drinks of alcohol    Comment: occasional   Drug use: No   Sexual activity: Yes    Birth control/protection: None  Other Topics Concern   Not on file  Social History Narrative   Not on file   Social Drivers of Health   Financial Resource Strain: Low Risk  (11/18/2023)   Overall Financial Resource Strain (CARDIA)    Difficulty of Paying Living Expenses: Not hard at all  Food Insecurity: No Food Insecurity (11/18/2023)   Hunger Vital Sign    Worried About Running Out of Food in the Last Year: Never true    Ran Out of Food in the Last Year: Never true  Transportation Needs: No Transportation Needs (11/18/2023)   PRAPARE - Administrator, Civil Service (Medical): No    Lack of Transportation (Non-Medical): No  Physical Activity: Sufficiently Active (11/18/2023)   Exercise Vital Sign    Days of Exercise per Week: 4 days    Minutes of Exercise per Session: 40 min  Stress: No Stress Concern Present (11/18/2023)   [employer] of Occupational Health - Occupational Stress Questionnaire    Feeling of Stress : Only a little  Social Connections: Socially Integrated (11/18/2023)   Social Connection and Isolation Panel [NHANES]    Frequency of Communication with Friends and Family: Three times a week    Frequency of Social Gatherings with Friends and Family: Once a week    Attends Religious Services: More than 4 times per year    Active Member of [employer] or Organizations: Yes    Attends Engineer, structural: More than 4 times per year    Marital Status: Married  Catering manager Violence: Unknown (10/06/2021)   Received from [employer], [facility]    HITS    Physically Hurt: Not on file    Insult or Talk Down To: Not on file    Threaten Physical Harm: Not on file    Scream or Curse: Not on file   Family History  Problem Relation Age of Onset   Cancer Mother        colon cancer   Colon polyps Mother    Current Outpatient Medications on File Prior to Visit  Medication Sig   amLODipine  (NORVASC ) 5 MG tablet TAKE 1 TABLET(5 MG) BY MOUTH DAILY   Multiple Vitamins-Minerals (MULTIVITAMIN WITH MINERALS) tablet Take 1 tablet by mouth daily.   valsartan  (DIOVAN ) 80 MG tablet Take 1 tablet (  80 mg total) by mouth daily.   No current facility-administered medications on file prior to visit.    Review of Systems  Constitutional:  Negative for activity change, appetite change, chills, diaphoresis, fatigue and fever.  HENT:  Negative for congestion and hearing loss.   Eyes:  Negative for visual disturbance.  Respiratory:  Negative for cough, chest tightness, shortness of breath and wheezing.   Cardiovascular:  Negative for chest pain, palpitations and leg swelling.  Gastrointestinal:  Negative for abdominal pain, constipation, diarrhea, nausea and vomiting.  Genitourinary:  Positive for frequency. Negative for dysuria and hematuria.  Musculoskeletal:  Negative for arthralgias and neck pain.  Skin:  Negative for rash.  Neurological:  Negative for dizziness, weakness, light-headedness, numbness and headaches.  Hematological:  Negative for adenopathy.  Psychiatric/Behavioral:  Negative for behavioral problems, dysphoric mood and sleep disturbance.    Per HPI unless specifically indicated above     Objective:     BP (!) 170/90 (BP Location: Left Arm, Cuff Size: Normal)   Pulse 92   Ht 5\' 7"  (1.702 m)   Wt 182 lb 2 oz (82.6 kg)   SpO2 95%   BMI 28.52 kg/m   Wt Readings from Last 3 Encounters:  11/20/23 182 lb 2 oz (82.6 kg)  11/08/23 185 lb (83.9 kg)  10/17/22 180 lb (81.6 kg)    Physical Exam Vitals and nursing note reviewed.   Constitutional:      General: He is not in acute distress.    Appearance: He is well-developed. He is not diaphoretic.     Comments: Well-appearing, comfortable, cooperative  HENT:     Head: Normocephalic and atraumatic.  Eyes:     General:        Right eye: No discharge.        Left eye: No discharge.     Conjunctiva/sclera: Conjunctivae normal.     Pupils: Pupils are equal, round, and reactive to light.  Neck:     Thyroid: No thyromegaly.     Vascular: No carotid bruit.  Cardiovascular:     Rate and Rhythm: Normal rate and regular rhythm.     Pulses: Normal pulses.     Heart sounds: Normal heart sounds. No murmur heard. Pulmonary:     Effort: Pulmonary effort is normal. No respiratory distress.     Breath sounds: Normal breath sounds. No wheezing or rales.  Abdominal:     General: Bowel sounds are normal. There is no distension.     Palpations: Abdomen is soft. There is no mass.     Tenderness: There is no abdominal tenderness.  Musculoskeletal:        General: No tenderness. Normal range of motion.     Cervical back: Normal range of motion and neck supple.     Right lower leg: No edema.     Left lower leg: No edema.     Comments: Upper / Lower Extremities: - Normal muscle tone, strength bilateral upper extremities 5/5, lower extremities 5/5  Lymphadenopathy:     Cervical: No cervical adenopathy.  Skin:    General: Skin is warm and dry.     Findings: No erythema or rash.  Neurological:     Mental Status: He is alert and oriented to person, place, and time.     Comments: Distal sensation intact to light touch all extremities  Psychiatric:        Mood and Affect: Mood normal.        Behavior: Behavior normal.  Thought Content: Thought content normal.     Comments: Well groomed, good eye contact, normal speech and thoughts     Results for orders placed or performed in visit on 11/13/23  Lipid panel   Collection Time: 11/13/23  7:35 AM  Result Value Ref Range    Cholesterol, Total 224 (H) 100 - 199 mg/dL   Triglycerides 67 0 - 149 mg/dL   HDL 85 >60 mg/dL   VLDL Cholesterol Cal 12 5 - 40 mg/dL   LDL Chol Calc (NIH) 454 (H) 0 - 99 mg/dL   Chol/HDL Ratio 2.6 0.0 - 5.0 ratio  Hemoglobin A1c   Collection Time: 11/13/23  7:35 AM  Result Value Ref Range   Hgb A1c MFr Bld 5.9 (H) 4.8 - 5.6 %   Est. average glucose Bld gHb Est-mCnc 123 mg/dL  CBC with Differential/Platelet   Collection Time: 11/13/23  7:35 AM  Result Value Ref Range   WBC 6.1 3.4 - 10.8 x10E3/uL   RBC 5.43 4.14 - 5.80 x10E6/uL   Hemoglobin 15.2 13.0 - 17.7 g/dL   Hematocrit 09.8 11.9 - 51.0 %   MCV 86 79 - 97 fL   MCH 28.0 26.6 - 33.0 pg   MCHC 32.7 31.5 - 35.7 g/dL   RDW 14.7 82.9 - 56.2 %   Platelets 215 150 - 450 x10E3/uL   Neutrophils 58 Not Estab. %   Lymphs 35 Not Estab. %   Monocytes 5 Not Estab. %   Eos 2 Not Estab. %   Basos 0 Not Estab. %   Neutrophils Absolute 3.5 1.4 - 7.0 x10E3/uL   Lymphocytes Absolute 2.1 0.7 - 3.1 x10E3/uL   Monocytes Absolute 0.3 0.1 - 0.9 x10E3/uL   EOS (ABSOLUTE) 0.1 0.0 - 0.4 x10E3/uL   Basophils Absolute 0.0 0.0 - 0.2 x10E3/uL   Immature Granulocytes 0 Not Estab. %   Immature Grans (Abs) 0.0 0.0 - 0.1 x10E3/uL  PSA   Collection Time: 11/13/23  7:35 AM  Result Value Ref Range   Prostate Specific Ag, Serum 2.8 0.0 - 4.0 ng/mL  TSH   Collection Time: 11/13/23  7:35 AM  Result Value Ref Range   TSH 2.050 0.450 - 4.500 uIU/mL  Comprehensive metabolic panel with GFR   Collection Time: 11/13/23  7:35 AM  Result Value Ref Range   Glucose 104 (H) 70 - 99 mg/dL   BUN 16 6 - 24 mg/dL   Creatinine, Ser 1.30 (H) 0.76 - 1.27 mg/dL   eGFR 64 >86 VH/QIO/9.62   BUN/Creatinine Ratio 12 9 - 20   Sodium 139 134 - 144 mmol/L   Potassium 4.2 3.5 - 5.2 mmol/L   Chloride 99 96 - 106 mmol/L   CO2 25 20 - 29 mmol/L   Calcium 9.9 8.7 - 10.2 mg/dL   Total Protein 7.3 6.0 - 8.5 g/dL   Albumin 4.8 3.8 - 4.9 g/dL   Globulin, Total 2.5 1.5 - 4.5  g/dL   Bilirubin Total 0.4 0.0 - 1.2 mg/dL   Alkaline Phosphatase 60 44 - 121 IU/L   AST 25 0 - 40 IU/L   ALT 29 0 - 44 IU/L  Urinalysis, Routine w reflex microscopic   Collection Time: 11/13/23  7:38 AM  Result Value Ref Range   Specific Gravity, UA 1.014 1.005 - 1.030   pH, UA 6.5 5.0 - 7.5   Color, UA Yellow Yellow   Appearance Ur Clear Clear   Leukocytes,UA Negative Negative   Protein,UA Negative Negative/Trace  Glucose, UA Negative Negative   Ketones, UA Negative Negative   RBC, UA Negative Negative   Bilirubin, UA Negative Negative   Urobilinogen, Ur 0.2 0.2 - 1.0 mg/dL   Nitrite, UA Negative Negative   Microscopic Examination Comment       Assessment & Plan:   Problem List Items Addressed This Visit     Essential hypertension   Relevant Medications   nebivolol (BYSTOLIC) 5 MG tablet   Pre-diabetes   Other Visit Diagnoses       Annual physical exam    -  Primary     Screening for prostate cancer         Elevated PSA, less than 10 ng/ml            Updated Health Maintenance information Reviewed recent lab results with patient Encouraged improvement to lifestyle with diet and exercise  Hypertension Hypertension remains uncontrolled. Blood pressure elevated at 175/102 mmHg. Current medications may be insufficient and also question adherence to medications given lapse on Amlodipine  for appropriate reasoning with his dental procedures and gum condition. There is a legitimate side effect concern of possible gingival hyperplasia / bleeding.  Failed Valsartan  160mg  due to itching hypersensitivity reaction  Failed Lisinopril  ACEi-Cough, HCTZ/Losartan  - uncertain why, Amlodipine  10mg  - gum swelling   Discussed adding next line with betablocker - Start nebivolol 5 mg once daily. - Continue valsartan  80mg  and amlodipine  5mg  - Monitor blood pressure regularly. - Continue with clinical pharmacy management  - Discussion again today same as last year with emphasis on  necessity to further evaluate and manage other possible or secondary causes of his difficult to control hypertension. Concern very poor sleep (self inflicted by late bedtime and early wake up for work) - no obvious history of OSA. - Address sleep hygiene, consider melatonin 5 mg nightly. - Also discussed anxiety role. This may be important to treat as well however I do not believe anxiety alone would cause this elevation in BP  - Follow up in 3 months to reassess blood pressure control. - Consider referral to [facility] Cardiologist in [place] if uncontrolled still. I have offered this previously, he has declined.  Prediabetes A1c increased from 5.8% to 5.9%, indicating prediabetes. Emphasized lifestyle modifications to prevent diabetes. - Provide dietary guidance to reduce starches and sugars. - Encourage lifestyle modifications.  Elevated LDL cholesterol LDL increased from 113 mg/dL to 161 mg/dL. Discussed dietary factors and potential medication if lifestyle changes insufficient. - Provide dietary guidance to reduce high-fat and high-cholesterol foods. - I have recommended Statin therapy. He has declined today. We can reconsider or check CT coronary calcium next  General Health Maintenance Declined shingles vaccination. Colonoscopy negative in 2018, next due 2028. PSA normal but increased, will monitor. Discussed heart scan for cardiovascular risk. - Repeat PSA test in 6 months. - Consider scheduling CT Coronary Calcium score heart scan for cardiovascular risk assessment.  He declines order today but will notify us  if ready to order.     CC chart to Timoteo Force   No orders of the defined types were placed in this encounter.   Meds ordered this encounter  Medications   nebivolol (BYSTOLIC) 5 MG tablet    Sig: Take 1 tablet (5 mg total) by mouth daily.    Dispense:  30 tablet    Refill:  2     Follow up plan: Return in about 3 months (around 02/20/2024), or 3  month follow-up HTN, for 3 month follow-up HTN.  Future plans to repeat PSA 6 months  Domingo Friend, DO [facility] 11/20/2023, 3:21 PM

## 2023-12-02 ENCOUNTER — Telehealth: Payer: Self-pay | Admitting: Pharmacist

## 2023-12-02 ENCOUNTER — Other Ambulatory Visit

## 2023-12-02 NOTE — Telephone Encounter (Signed)
   Outreach Note  12/02/2023 Name: Ronald Sweeney MRN: [id number] DOB: 13-Dec-1965  Referred by: Raina Bunting, DO  Was unable to reach patient via telephone today and have left HIPAA compliant voicemail asking patient to return my call.   Follow Up Plan: Will collaborate with Care Guide to outreach to schedule follow up with me  Arthur Lash, PharmD, Becky Bowels, CPP Clinical Pharmacist [facility] [phone]

## 2023-12-18 ENCOUNTER — Telehealth: Payer: Self-pay

## 2023-12-18 ENCOUNTER — Other Ambulatory Visit (INDEPENDENT_AMBULATORY_CARE_PROVIDER_SITE_OTHER): Admitting: Pharmacist

## 2023-12-18 DIAGNOSIS — I10 Essential (primary) hypertension: Secondary | ICD-10-CM

## 2023-12-18 NOTE — Progress Notes (Signed)
 Complex Care Management Care Guide Note  12/18/2023 Name: Ronald Sweeney MRN: [id number] DOB: [birth year]  Ronald Sweeney is a 58 y.o. year old male who is a primary care patient of Raina Bunting, DO and is actively engaged with the care management team. I reached out to Madie Schilling by phone today to assist with re-scheduling  with the Pharmacist.  Follow up plan: Unsuccessful telephone outreach attempt made. A HIPAA compliant phone message was left for the patient providing contact information and requesting a return call.  Lenton Rail , RMA     [facility]  [facility] Guide  Direct Dial: [phone]  Website: [email or web address]

## 2023-12-18 NOTE — Patient Instructions (Signed)
Check your blood pressure once daily, and any time you have concerning symptoms like headache, chest pain, dizziness, shortness of breath, or vision changes.   Our goal is less than 130/80.  To appropriately check your blood pressure, make sure you do the following:  1) Avoid caffeine, exercise, or tobacco products for 30 minutes before checking. Empty your bladder. 2) Sit with your back supported in a flat-backed chair. Rest your arm on something flat (arm of the chair, table, etc). 3) Sit still with your feet flat on the floor, resting, for at least 5 minutes.  4) Check your blood pressure. Take 1-2 readings.  5) Write down these readings and bring with you to any provider appointments.  Bring your home blood pressure machine with you to a provider's office for accuracy comparison at least once a year.   Make sure you take your blood pressure medications before you come to any office visit, even if you were asked to fast for labs.  Elisabeth Delles, PharmD, BCACP [facility] [phone]  

## 2023-12-18 NOTE — Progress Notes (Signed)
 12/18/2023 Name: Ronald Sweeney MRN: [id number] DOB: July 27, 1965  Chief Complaint  Patient presents with   Medication Management    Batu Ronald Sweeney is a 58 y.o. year old male who is appearing on report for [employer] [facility] Hypertension Control due to last documented ambulatory blood pressure of 170/90 on 11/20/2023. Next appointment with PCP is 02/25/2024.   Receive voicemail from patient today requesting a call back. Return call to patient.  Subjective:  Care Team: Primary Care Provider: Raina Bunting, DO ; Next Scheduled Visit: 02/25/2024   Medication Access/Adherence  Current Pharmacy:  [id number] - [place], [place] - [address] AT [facility] OF [address] [place] [address] [place] [address] Phone: [phone] Fax: [phone]    Lab Results  Component Value Date   CREATININE 1.30 (H) 11/13/2023   BUN 16 11/13/2023   NA 139 11/13/2023   K 4.2 11/13/2023   CL 99 11/13/2023   CO2 25 11/13/2023   BP Readings from Last 3 Encounters:  11/20/23 (!) 170/90  11/08/23 (!) 179/96  10/17/22 (!) 170/99   Pulse Readings from Last 3 Encounters:  11/20/23 92  11/08/23 68  10/17/22 (!) 112    Current Outpatient Medications on File Prior to Visit  Medication Sig Dispense Refill   amLODipine  (NORVASC ) 5 MG tablet TAKE 1 TABLET(5 MG) BY MOUTH DAILY 90 tablet 1   Multiple Vitamins-Minerals (MULTIVITAMIN WITH MINERALS) tablet Take 1 tablet by mouth daily.     nebivolol  (BYSTOLIC ) 5 MG tablet Take 1 tablet (5 mg total) by mouth daily. 30 tablet 2   valsartan  (DIOVAN ) 80 MG tablet Take 1 tablet (80 mg total) by mouth daily. 90 tablet 0   No current facility-administered medications on file prior to visit.    Current medications:  - amlodipine  5 mg daily - valsartan  80 mg day  - nebivolol  5 mg daily   Previous therapies tried: amlodipine  10 mg (gum/leg swelling); lisinopril  (ACEi-cough); HCTZ (unclear why stopped)   Home  Monitoring: Patient has an automated upper arm home BP machine Unable to review specific reading today as does not have his log with him, but recalls recent systolic readings ranging in 147-160 (checked at same time as taking morning medications)   Admits to increased salt intake recently   Caffeine: reports has stopped drinking coffee     Assessment/Plan: - Currently uncontrolled - Have reviewed goal blood pressure <130/80 - Reviewed appropriate administration of medication regimen - Counseled on long term microvascular and macrovascular complications of uncontrolled hypertension - Reviewed appropriate home BP monitoring technique (avoid caffeine, smoking, and exercise for 30 minutes before checking, rest for at least 5 minutes before taking BP, sit with feet flat on the floor and back against a hard surface, uncross legs, and rest arm on flat surface)             Counseled to check at least 1-2 hours after morning blood pressure medicine  - Discussed dietary modifications, such as reduced salt intake - Patient prefers to hold off on referral to Hypertension Clinic today, rather prefers to discuss again next week if readings remain elevated - Reviewed to check blood pressure daily, document, and provide at next provider visit, but to contact office sooner if readings outside established parameters or for any symptoms      Follow Up Plan:    Clinical Pharmacist will outreach to patient by telephone again on 12/25/2023 at 1:30 PM  Arthur Lash, PharmD, [facility] Clinical Pharmacist [facility] [phone]

## 2023-12-25 ENCOUNTER — Telehealth: Payer: Self-pay | Admitting: Pharmacist

## 2023-12-25 ENCOUNTER — Other Ambulatory Visit

## 2023-12-25 NOTE — Telephone Encounter (Signed)
   Outreach Note  12/25/2023 Name: Ronald Sweeney MRN: [id number] DOB: Sep 04, 1965  Referred by: Edman Marsa PARAS, DO  Was unable to reach patient via telephone today and have left HIPAA compliant voicemail asking patient to return my call.    Follow Up Plan: Will collaborate with Care Guide to outreach to schedule follow up with me  Sharyle Sia, PharmD, JAQUELINE, CPP Clinical Pharmacist [facility] [phone]

## 2024-02-01 ENCOUNTER — Other Ambulatory Visit: Payer: Self-pay | Admitting: Family Medicine

## 2024-02-01 DIAGNOSIS — I10 Essential (primary) hypertension: Secondary | ICD-10-CM

## 2024-02-03 ENCOUNTER — Other Ambulatory Visit: Payer: Self-pay

## 2024-02-03 ENCOUNTER — Other Ambulatory Visit: Payer: Self-pay | Admitting: Family Medicine

## 2024-02-03 DIAGNOSIS — I10 Essential (primary) hypertension: Secondary | ICD-10-CM

## 2024-02-03 MED ORDER — AMLODIPINE BESYLATE 5 MG PO TABS
5.0000 mg | ORAL_TABLET | Freq: Every day | ORAL | 1 refills | Status: DC
Start: 2024-02-03 — End: 2024-05-04

## 2024-02-04 NOTE — Telephone Encounter (Signed)
 Requested Prescriptions  Pending Prescriptions Disp Refills   valsartan  (DIOVAN ) 80 MG tablet [Pharmacy Med Name: VALSARTAN  80MG  TABLETS] 90 tablet 1    Sig: TAKE 1 TABLET(80 MG) BY MOUTH DAILY     Cardiovascular:  Angiotensin Receptor Blockers Failed - 02/04/2024  4:38 PM      Failed - Cr in normal range and within 180 days    Creat  Date Value Ref Range Status  10/11/2022 1.25 0.70 - 1.30 mg/dL Final   Creatinine, Ser  Date Value Ref Range Status  11/13/2023 1.30 (H) 0.76 - 1.27 mg/dL Final         Failed - Last BP in normal range    BP Readings from Last 1 Encounters:  11/20/23 (!) 170/90         Passed - K in normal range and within 180 days    Potassium  Date Value Ref Range Status  11/13/2023 4.2 3.5 - 5.2 mmol/L Final         Passed - Patient is not pregnant      Passed - Valid encounter within last 6 months    Recent Outpatient Visits           2 months ago Annual physical exam   [facility] [place], Marsa PARAS, DO

## 2024-02-15 ENCOUNTER — Encounter: Payer: Self-pay | Admitting: Family Medicine

## 2024-02-15 ENCOUNTER — Other Ambulatory Visit: Payer: Self-pay | Admitting: Family Medicine

## 2024-02-15 DIAGNOSIS — I10 Essential (primary) hypertension: Secondary | ICD-10-CM

## 2024-02-17 ENCOUNTER — Other Ambulatory Visit: Payer: Self-pay

## 2024-02-17 DIAGNOSIS — I10 Essential (primary) hypertension: Secondary | ICD-10-CM

## 2024-02-17 MED ORDER — NEBIVOLOL HCL 5 MG PO TABS: 5.0000 mg | ORAL_TABLET | Freq: Every day | ORAL | 2 refills | Status: DC

## 2024-02-18 NOTE — Telephone Encounter (Signed)
 Refused Bystolic  5 mg because it was sent on 02/17/2024 #30, 2 refills.   This is a duplicate request.

## 2024-02-25 ENCOUNTER — Ambulatory Visit: Admitting: Family Medicine

## 2024-03-06 ENCOUNTER — Other Ambulatory Visit

## 2024-04-17 NOTE — Progress Notes (Signed)
 Ronald Sweeney                                          MRN: [id number]   04/17/2024   The [facility]I Quality Team Specialist reviewed this patient medical record for the purposes of chart review for care gap closure. The following were reviewed: chart review for care gap closure-controlling blood pressure.    VBCI Quality Team

## 2024-04-23 ENCOUNTER — Other Ambulatory Visit: Payer: Self-pay | Admitting: Family Medicine

## 2024-04-23 DIAGNOSIS — I10 Essential (primary) hypertension: Secondary | ICD-10-CM

## 2024-04-24 NOTE — Telephone Encounter (Signed)
 Requested Prescriptions  Pending Prescriptions Disp Refills   nebivolol  (BYSTOLIC ) 5 MG tablet [Pharmacy Med Name: NEBIVOLOL  5MG  TABLETS] 90 tablet 0    Sig: TAKE 1 TABLET(5 MG) BY MOUTH DAILY     Cardiovascular: Beta Blockers 3 Failed - 04/24/2024  4:14 PM      Failed - Cr in normal range and within 360 days    Creat  Date Value Ref Range Status  10/11/2022 1.25 0.70 - 1.30 mg/dL Final   Creatinine, Ser  Date Value Ref Range Status  11/13/2023 1.30 (H) 0.76 - 1.27 mg/dL Final         Failed - Last BP in normal range    BP Readings from Last 1 Encounters:  11/20/23 (!) 170/90         Passed - AST in normal range and within 360 days    AST  Date Value Ref Range Status  11/13/2023 25 0 - 40 IU/L Final         Passed - ALT in normal range and within 360 days    ALT  Date Value Ref Range Status  11/13/2023 29 0 - 44 IU/L Final         Passed - Last Heart Rate in normal range    Pulse Readings from Last 1 Encounters:  11/20/23 92         Passed - Valid encounter within last 6 months    Recent Outpatient Visits           5 months ago Annual physical exam   [facility], Marsa PARAS, DO

## 2024-05-04 ENCOUNTER — Other Ambulatory Visit: Payer: Self-pay | Admitting: Family Medicine

## 2024-05-04 ENCOUNTER — Encounter: Payer: Self-pay | Admitting: Family Medicine

## 2024-05-04 DIAGNOSIS — I10 Essential (primary) hypertension: Secondary | ICD-10-CM

## 2024-05-04 MED ORDER — AMLODIPINE BESYLATE 5 MG PO TABS: 5.0000 mg | ORAL_TABLET | Freq: Every day | ORAL | 1 refills | Status: AC

## 2024-05-04 MED ORDER — VALSARTAN 80 MG PO TABS: 80.0000 mg | ORAL_TABLET | Freq: Every day | ORAL | 1 refills | Status: AC

## 2024-05-05 NOTE — Telephone Encounter (Signed)
 Requested Prescriptions  Refused Prescriptions Disp Refills   amLODipine  (NORVASC ) 5 MG tablet [Pharmacy Med Name: AMLODIPINE  BESYLATE 5MG  TABLETS] 90 tablet 1    Sig: TAKE 1 TABLET(5 MG) BY MOUTH DAILY     Cardiovascular: Calcium Channel Blockers 2 Failed - 05/05/2024  5:31 PM      Failed - Last BP in normal range    BP Readings from Last 1 Encounters:  11/20/23 (!) 170/90         Passed - Last Heart Rate in normal range    Pulse Readings from Last 1 Encounters:  11/20/23 92         Passed - Valid encounter within last 6 months    Recent Outpatient Visits           5 months ago Annual physical exam   [facility], Marsa PARAS, [place]

## 2024-05-12 ENCOUNTER — Ambulatory Visit: Admitting: Family Medicine

## 2024-05-12 VITALS — BP 150/90 | HR 68 | Ht 67.0 in | Wt 192.2 lb

## 2024-05-12 DIAGNOSIS — R29818 Other symptoms and signs involving the nervous system: Secondary | ICD-10-CM | POA: Diagnosis not present

## 2024-05-12 DIAGNOSIS — I10 Essential (primary) hypertension: Secondary | ICD-10-CM | POA: Diagnosis not present

## 2024-05-12 DIAGNOSIS — R7303 Prediabetes: Secondary | ICD-10-CM

## 2024-05-12 MED ORDER — NEBIVOLOL HCL 5 MG PO TABS: 5.0000 mg | ORAL_TABLET | Freq: Every day | ORAL | 1 refills | Status: AC

## 2024-05-12 NOTE — Progress Notes (Signed)
 Subjective:    Patient ID: Ronald Sweeney, male    DOB: Nov 06, 1965, 58 y.o.   MRN: [id number]  Ronald Sweeney is a 58 y.o. male presenting on 05/12/2024 for Hypertension   HPI  Discussed the use of AI scribe software for clinical note transcription with the patient, who gave verbal consent to proceed.  History of Present Illness   Ronald Sweeney is a 58 year old male with hypertension who presents for a follow-up regarding blood pressure management.  Hypertension Chronic problem, he has had difficulty getting controlled. Missed last apt now has run out of some meds.  - Elevated blood pressure with recent reading of 150/92 mmHg - Previously well-controlled blood pressure with readings around 130/135 mmHg prior to recent changes so he was improving when he adhered to lifestyle regimen diet exercise - Attributed elevated blood pressure to dietary changes and inconsistent medication adherence over the past three weeks - Partially taking prescribed antihypertensive medications (amlodipine , nabivolol, valsartan ) due to running low on supply - Delay in medication refills due to requirement for a doctor's visit before refilling prescriptions  Dietary habits - Increased consumption of restaurant and fast food items such as cheeseburgers, fries, and pizza over the past three weeks  Physical activity - Decreased exercise frequency due to weather and schedule changes - Typically exercises four days a week during lunch, which supports dietary and health focus - Felt sluggish and less motivated to exercise over the past month  Sleep disturbance - Sleep quality had previously improved but has worsened again over the last few weeks - Previous discussion regarding possible sleep apnea and consideration of a sleep study  - Currently on nabivolol without significant side effects, though experiencing recent fatigue not attributed to medication     Previuosly Working with clinical  pharmacy with improvement of BP 130-140s  Continues Bystolic  5mg  daily  Continues Valsartan  80mg  daily  Continues Amlodipine  5mg  daily  Suspected Sleep Apnea Insomnia Poor sleep hygiene, late bedtime, wake up early He experiences sleep deprivation, averaging about four hours of sleep per night due to his work schedule, which he believes may be contributing to his elevated blood pressure.  Admits daytime sleepiness, question if related to awakening or poor sleep       Health Maintenance:    Colonoscopy 11/29/16, Dr Jinny, negative, repeat in 10 years, 2028.  Epworth Sleepiness Scale Total Score: 11 Sitting and reading - 2 Watching TV - 3 Sitting inactive in a public place - 3 As a passenger in a car for an hour without a break - 2 Lying down to rest in the afternoon when circumstances permit - 1 Sitting and talking to someone - 0 Sitting quietly after a lunch without alcohol - 0 In a car, while stopped for a few minutes in traffic - 0   Health Maintenance: Offered Pneumonia vaccine prevnar 20  Future Hep B Vaccine     10/17/2022    4:14 PM 09/05/2022    4:08 PM 01/10/2022    4:09 PM  Depression screen PHQ 2/9  Decreased Interest 0 0 0  Down, Depressed, Hopeless 0 0 0  PHQ - 2 Score 0 0 0  Altered sleeping 2 1   Tired, decreased energy 0 0   Change in appetite 0 0   Feeling bad or failure about yourself  0 0   Trouble concentrating 0 0   Moving slowly or fidgety/restless 0 0   Suicidal thoughts 0 0   PHQ-9  Score 2  1    Difficult doing work/chores  Not difficult at all      Data saved with a previous flowsheet row definition       10/17/2022    4:14 PM 09/05/2022    4:09 PM  GAD 7 : Generalized Anxiety Score  Nervous, Anxious, on Edge 0 0  Control/stop worrying 0 0  Worry too much - different things 0 1  Trouble relaxing 0 0  Restless 0 0  Easily annoyed or irritable 1 0  Afraid - awful might happen 0 0  Total GAD 7 Score 1 1  Anxiety Difficulty  Not  difficult at all    Social History   Tobacco Use   Smoking status: Never   Smokeless tobacco: Never  Substance Use Topics   Alcohol use: Yes    Alcohol/week: 0.0 standard drinks of alcohol    Comment: occasional   Drug use: No    Review of Systems Per HPI unless specifically indicated above     Objective:    BP (!) 150/90 (BP Location: Left Arm, Cuff Size: Normal)   Pulse 68   Ht 5' 7 (1.702 m)   Wt 192 lb 4 oz (87.2 kg)   SpO2 99%   BMI 30.11 kg/m   Wt Readings from Last 3 Encounters:  05/12/24 192 lb 4 oz (87.2 kg)  11/20/23 182 lb 2 oz (82.6 kg)  11/08/23 185 lb (83.9 kg)    Physical Exam Vitals and nursing note reviewed.  Constitutional:      General: He is not in acute distress.    Appearance: Normal appearance. He is well-developed. He is not diaphoretic.     Comments: Well-appearing, comfortable, cooperative  HENT:     Head: Normocephalic and atraumatic.  Eyes:     General:        Right eye: No discharge.        Left eye: No discharge.     Conjunctiva/sclera: Conjunctivae normal.  Neck:     Thyroid: No thyromegaly.  Cardiovascular:     Rate and Rhythm: Normal rate and regular rhythm.     Pulses: Normal pulses.     Heart sounds: Normal heart sounds. No murmur heard. Pulmonary:     Effort: Pulmonary effort is normal. No respiratory distress.     Breath sounds: Normal breath sounds. No wheezing or rales.  Musculoskeletal:        General: Normal range of motion.     Cervical back: Normal range of motion and neck supple.  Lymphadenopathy:     Cervical: No cervical adenopathy.  Skin:    General: Skin is warm and dry.     Findings: No erythema or rash.  Neurological:     Mental Status: He is alert and oriented to person, place, and time. Mental status is at baseline.  Psychiatric:        Mood and Affect: Mood normal.        Behavior: Behavior normal.        Thought Content: Thought content normal.     Comments: Well groomed, good eye contact,  normal speech and thoughts     Results for orders placed or performed in visit on 11/13/23  Lipid panel   Collection Time: 11/13/23  7:35 AM  Result Value Ref Range   Cholesterol, Total 224 (H) 100 - 199 mg/dL   Triglycerides 67 0 - 149 mg/dL   HDL 85 >60 mg/dL   VLDL Cholesterol Cal 12  5 - 40 mg/dL   LDL Chol Calc (NIH) 872 (H) 0 - 99 mg/dL   Chol/HDL Ratio 2.6 0.0 - 5.0 ratio  Hemoglobin A1c   Collection Time: 11/13/23  7:35 AM  Result Value Ref Range   Hgb A1c MFr Bld 5.9 (H) 4.8 - 5.6 %   Est. average glucose Bld gHb Est-mCnc 123 mg/dL  CBC with Differential/Platelet   Collection Time: 11/13/23  7:35 AM  Result Value Ref Range   WBC 6.1 3.4 - 10.8 x10E3/uL   RBC 5.43 4.14 - 5.80 x10E6/uL   Hemoglobin 15.2 13.0 - 17.7 g/dL   Hematocrit 53.4 62.4 - 51.0 %   MCV 86 79 - 97 fL   MCH 28.0 26.6 - 33.0 pg   MCHC 32.7 31.5 - 35.7 g/dL   RDW 87.4 88.3 - 84.5 %   Platelets 215 150 - 450 x10E3/uL   Neutrophils 58 Not Estab. %   Lymphs 35 Not Estab. %   Monocytes 5 Not Estab. %   Eos 2 Not Estab. %   Basos 0 Not Estab. %   Neutrophils Absolute 3.5 1.4 - 7.0 x10E3/uL   Lymphocytes Absolute 2.1 0.7 - 3.1 x10E3/uL   Monocytes Absolute 0.3 0.1 - 0.9 x10E3/uL   EOS (ABSOLUTE) 0.1 0.0 - 0.4 x10E3/uL   Basophils Absolute 0.0 0.0 - 0.2 x10E3/uL   Immature Granulocytes 0 Not Estab. %   Immature Grans (Abs) 0.0 0.0 - 0.1 x10E3/uL  PSA   Collection Time: 11/13/23  7:35 AM  Result Value Ref Range   Prostate Specific Ag, Serum 2.8 0.0 - 4.0 ng/mL  TSH   Collection Time: 11/13/23  7:35 AM  Result Value Ref Range   TSH 2.050 0.450 - 4.500 uIU/mL  Comprehensive metabolic panel with GFR   Collection Time: 11/13/23  7:35 AM  Result Value Ref Range   Glucose 104 (H) 70 - 99 mg/dL   BUN 16 6 - 24 mg/dL   Creatinine, Ser 8.69 (H) 0.76 - 1.27 mg/dL   eGFR 64 >40 fO/fpw/8.26   BUN/Creatinine Ratio 12 9 - 20   Sodium 139 134 - 144 mmol/L   Potassium 4.2 3.5 - 5.2 mmol/L   Chloride 99 96  - 106 mmol/L   CO2 25 20 - 29 mmol/L   Calcium 9.9 8.7 - 10.2 mg/dL   Total Protein 7.3 6.0 - 8.5 g/dL   Albumin 4.8 3.8 - 4.9 g/dL   Globulin, Total 2.5 1.5 - 4.5 g/dL   Bilirubin Total 0.4 0.0 - 1.2 mg/dL   Alkaline Phosphatase 60 44 - 121 IU/L   AST 25 0 - 40 IU/L   ALT 29 0 - 44 IU/L  Urinalysis, Routine w reflex microscopic   Collection Time: 11/13/23  7:38 AM  Result Value Ref Range   Specific Gravity, UA 1.014 1.005 - 1.030   pH, UA 6.5 5.0 - 7.5   Color, UA Yellow Yellow   Appearance Ur Clear Clear   Leukocytes,UA Negative Negative   Protein,UA Negative Negative/Trace   Glucose, UA Negative Negative   Ketones, UA Negative Negative   RBC, UA Negative Negative   Bilirubin, UA Negative Negative   Urobilinogen, Ur 0.2 0.2 - 1.0 mg/dL   Nitrite, UA Negative Negative   Microscopic Examination Comment       Assessment & Plan:   Problem List Items Addressed This Visit     Essential hypertension - Primary   Relevant Medications   nebivolol  (BYSTOLIC ) 5 MG tablet  Pre-diabetes   Other Visit Diagnoses       Suspected sleep apnea            Resistant hypertension Hypertension uncontrolled at 150/92 mmHg due to dietary indiscretions and inconsistent medication adherence and lost to follow-up, anticipated 3 month visit and he missed last apt now it has been 6 months and ran out of med.  Previous medications include amlodipine , valsartan , and nebivolol . Nebivolol  added for heart rate and blood pressure management last time. He was doing better on meds and lifestyle. Now missed doses and poor diet and less active and worsening BP.  Note past history with hypertension, we have been attempting to manage his BP since 2019, resumed care in 2022 and has had persistent difficulty with BP control.   We have discussed possible secondary causes of HYPERTENSION. Possible OSA, see below  - Continue amlodipine  5mg  daily, valsartan  80mg  daily, and nebivolol  5mg  daily - Refilled  medications with 90-day supply and additional refill. - Encouraged diet and exercise modifications. - Referred to nutritionist through [facility] - Follow-up in three months to reassess blood pressure.  Sleep disturbance, possible sleep apnea Worsening sleep disturbance potentially contributing to hypertension. Home sleep study considered to evaluate for sleep apnea. As explanation for hypertension - Investigate coverage for home sleep study through [facility]. - Contact office to order home sleep study if interested.  General Health Maintenance Discussed pneumonia vaccination for individuals over 50 and heart scan for those with risk factors. - He declined vaccines at this time - Consider heart scan as future preventive measure. He declined      Note he is not immune to Hepatitis B, lab in 10/12/22 showed non reactive titer    No orders of the defined types were placed in this encounter.   Meds ordered this encounter  Medications   nebivolol  (BYSTOLIC ) 5 MG tablet    Sig: Take 1 tablet (5 mg total) by mouth daily.    Dispense:  90 tablet    Refill:  1    Add refills    Follow up plan: Return in about 3 months (around 08/12/2024) for 3 month follow-up BP, Nutrition, Sleep Study results.   Marsa Officer, DO [facility] 05/12/2024, 3:37 PM

## 2024-05-12 NOTE — Patient Instructions (Addendum)
 Thank you for coming to the office today.  Refills today for all 3 meds for future.  [email or web address]  [email or web address]  Check into [facility] Home Sleep Study  I can order it anytime if you are interested. Call or message.  FUTURE CONSIDERATION Coronary Calcium Score Cardiac CT Scan. This is a screening test for patients aged 57-50+ with cardiovascular risk factors or who are healthy but would be interested in Cardiovascular Screening for heart disease. Even if there is a family history of heart disease, this imaging can be useful. Typically it can be done every 5+ years or at a different timeline we agree on  The scan will look at the chest and mainly focus on the heart and identify early signs of calcium build up or blockages within the heart arteries. It is not 100% accurate for identifying blockages or heart disease, but it is useful to help us  predict who may have some early changes or be at risk in the future for a heart attack or cardiovascular problem.  The results are reviewed by a Cardiologist and they will document the results. It should become available on MyChart. Typically the results are divided into percentiles based on other patients of the same demographic and age. So it will compare your risk to others similar to you. If you have a higher score >99 or higher percentile >75%tile, it is recommended to consider Statin cholesterol therapy and or referral to Cardiologist. I will try to help explain your results and if we have questions we can contact the Cardiologist.  You will be contacted for scheduling. Usually it is done at any imaging facility through [facility], [facility] or [facility].  The cost is $99 flat fee total and it does not go through insurance, so no authorization is required.    Please schedule a Follow-up Appointment to: Return in about 3 months (around 08/12/2024) for 3 month follow-up BP, Nutrition,  Sleep Study results.  If you have any other questions or concerns, please feel free to call the office or send a message through MyChart. You may also schedule an earlier appointment if necessary.  Additionally, you may be receiving a survey about your experience at our office within a few days to 1 week by e-mail or mail. We value your feedback.  Marsa Officer, DO [facility], [place]

## 2024-07-06 ENCOUNTER — Encounter: Payer: Self-pay | Admitting: Family Medicine

## 2024-08-12 ENCOUNTER — Ambulatory Visit: Admitting: Family Medicine
